# Patient Record
Sex: Female | Born: 1990 | Race: White | Hispanic: No | Marital: Married | Smoking: Never smoker
Health system: Southern US, Community
[De-identification: ages and names within clinical notes are randomized; demographics above are authoritative.]

## PROBLEM LIST (undated history)

## (undated) DIAGNOSIS — Z803 Family history of malignant neoplasm of breast: Secondary | ICD-10-CM

## (undated) DIAGNOSIS — F329 Major depressive disorder, single episode, unspecified: Secondary | ICD-10-CM

## (undated) DIAGNOSIS — Z8049 Family history of malignant neoplasm of other genital organs: Secondary | ICD-10-CM

## (undated) DIAGNOSIS — F32A Depression, unspecified: Secondary | ICD-10-CM

## (undated) HISTORY — DX: Family history of malignant neoplasm of other genital organs: Z80.49

## (undated) HISTORY — DX: Family history of malignant neoplasm of breast: Z80.3

## (undated) HISTORY — DX: Major depressive disorder, single episode, unspecified: F32.9

## (undated) HISTORY — DX: Depression, unspecified: F32.A

---

## 2014-06-17 ENCOUNTER — Ambulatory Visit
Admit: 2014-06-17 | Disposition: A | Discharge status: Home / Self Care | Payer: Self-pay | Attending: Family Medicine | Admitting: Family Medicine

## 2014-08-06 ENCOUNTER — Telehealth: Payer: Self-pay | Admitting: Family Medicine

## 2014-08-06 DIAGNOSIS — F329 Major depressive disorder, single episode, unspecified: Secondary | ICD-10-CM

## 2014-08-06 DIAGNOSIS — F32A Depression, unspecified: Secondary | ICD-10-CM

## 2014-08-06 DIAGNOSIS — F419 Anxiety disorder, unspecified: Principal | ICD-10-CM

## 2014-08-06 MED ORDER — CITALOPRAM HYDROBROMIDE 40 MG PO TABS: 40.0000 mg | ORAL_TABLET | Freq: Every day | ORAL | Status: DC

## 2014-08-06 NOTE — Telephone Encounter (Signed)
Pt would like a increase on her Citalopram.

## 2014-08-06 NOTE — Telephone Encounter (Signed)
New prescription sent to [facility]. Pt will need to follow-up on medication changed in 1 month. Pt called and informed of change and need for appt.

## 2014-08-06 NOTE — Telephone Encounter (Signed)
Pt states she would like to increase dosage of Citaloparam as discussed.   Pharmacy: [facility]

## 2014-12-16 ENCOUNTER — Ambulatory Visit (INDEPENDENT_AMBULATORY_CARE_PROVIDER_SITE_OTHER): Payer: 59

## 2014-12-16 DIAGNOSIS — Z23 Encounter for immunization: Secondary | ICD-10-CM | POA: Diagnosis not present

## 2015-01-04 ENCOUNTER — Other Ambulatory Visit: Payer: Self-pay | Admitting: Family Medicine

## 2015-03-24 ENCOUNTER — Ambulatory Visit (INDEPENDENT_AMBULATORY_CARE_PROVIDER_SITE_OTHER): Payer: 59 | Admitting: Family Medicine

## 2015-03-24 ENCOUNTER — Encounter: Payer: Self-pay | Admitting: Family Medicine

## 2015-03-24 VITALS — BP 96/67 | HR 84 | Temp 99.0°F | Resp 16 | Ht 63.0 in | Wt 121.0 lb

## 2015-03-24 DIAGNOSIS — F418 Other specified anxiety disorders: Secondary | ICD-10-CM

## 2015-03-24 DIAGNOSIS — F419 Anxiety disorder, unspecified: Secondary | ICD-10-CM

## 2015-03-24 DIAGNOSIS — Z3041 Encounter for surveillance of contraceptive pills: Secondary | ICD-10-CM | POA: Diagnosis not present

## 2015-03-24 DIAGNOSIS — B360 Pityriasis versicolor: Secondary | ICD-10-CM | POA: Diagnosis not present

## 2015-03-24 DIAGNOSIS — F32A Depression, unspecified: Secondary | ICD-10-CM

## 2015-03-24 DIAGNOSIS — F329 Major depressive disorder, single episode, unspecified: Secondary | ICD-10-CM

## 2015-03-24 DIAGNOSIS — F3341 Major depressive disorder, recurrent, in partial remission: Secondary | ICD-10-CM | POA: Insufficient documentation

## 2015-03-24 MED ORDER — NORETHIN ACE-ETH ESTRAD-FE 1-20 MG-MCG PO TABS: 1.0000 | ORAL_TABLET | Freq: Every day | ORAL | Status: DC

## 2015-03-24 MED ORDER — SALICYLIC ACID-SULFUR 2-2 % EX SHAM: MEDICATED_SHAMPOO | Freq: Every day | CUTANEOUS | Status: DC | PRN

## 2015-03-24 MED ORDER — TERBINAFINE HCL 1 % EX CREA: 1.0000 "application " | TOPICAL_CREAM | Freq: Two times a day (BID) | CUTANEOUS | Status: DC

## 2015-03-24 MED ORDER — CITALOPRAM HYDROBROMIDE 40 MG PO TABS: 40.0000 mg | ORAL_TABLET | Freq: Every day | ORAL | Status: DC

## 2015-03-24 NOTE — Assessment & Plan Note (Signed)
Continue Celexa at current dosing. Pt is doing well. RTC 6 mos.

## 2015-03-24 NOTE — Progress Notes (Signed)
Subjective:    Patient ID: Debra Hodge, female    DOB: 03-18-1990, 25 y.o.   MRN: [id number]  HPI: Debra Hodge is a 25 y.o. female presenting on 03/24/2015 for Contraception; Rash; and Depression   HPI  Pt presents for refills of her medications and rash today.  She needs a refill on her OCP today. LMP 02/27/2015- light periods on OCP's.  Taking every day-no missed doses. No new family history of blood clots. No personal history of blood clots. No migraines with aura.  Celexa: Doing well on . No side effects from medication. Tolerating well. SYmptoms are much improved.   Rash- seen by dermatology in the past. Red scaling patch on Shoulder. On stomach and back.  Rash has been worsening over the past 1-1.5years.  Treated first 5 years ago. She thought it was treated with an antifungal.  Home treatment: tea tree oil cream- no relief. Thomes Cake.   Past Medical History  Diagnosis Date  . Depression     No current outpatient prescriptions on file prior to visit.   No current facility-administered medications on file prior to visit.    Review of Systems  Constitutional: Negative for fever and chills.  HENT: Negative.   Respiratory: Negative for cough, chest tightness and wheezing.   Cardiovascular: Negative for chest pain and leg swelling.  Gastrointestinal: Negative for nausea, vomiting, abdominal pain, diarrhea and constipation.  Endocrine: Negative.  Negative for cold intolerance, heat intolerance, polydipsia, polyphagia and polyuria.  Genitourinary: Negative for dysuria and difficulty urinating.  Musculoskeletal: Negative.   Skin: Positive for rash.  Neurological: Negative for dizziness, light-headedness and numbness.  Psychiatric/Behavioral: Positive for dysphoric mood. Negative for suicidal ideas, behavioral problems, sleep disturbance and agitation.   Per HPI unless specifically indicated above     Objective:    BP 96/67 mmHg  Pulse 84  Temp(Src) 99 F (37.2 C)  (Oral)  Resp 16  Ht  (1.6 m)  Wt 121 lb (54.885 kg)  BMI 21.44 kg/m2  LMP 02/27/2015  Wt Readings from Last 3 Encounters:  03/24/15 121 lb (54.885 kg)    Physical Exam  Constitutional: She is oriented to person, place, and time. She appears well-developed and well-nourished.  HENT:  Head: Normocephalic and atraumatic.  Neck: Neck supple.  Cardiovascular: Normal rate, regular rhythm and normal heart sounds.  Exam reveals no gallop and no friction rub.   No murmur heard. Pulmonary/Chest: Effort normal and breath sounds normal. She has no wheezes. She exhibits no tenderness.  Abdominal: Soft. Normal appearance and bowel sounds are normal. She exhibits no distension and no mass. There is no tenderness. There is no rebound and no guarding.  Musculoskeletal: Normal range of motion. She exhibits no edema or tenderness.  Lymphadenopathy:    She has no cervical adenopathy.  Neurological: She is alert and oriented to person, place, and time.  Skin: Skin is warm and dry. Rash noted.  Red scaling rash on L shoulder, abdomen and upper back.    No results found for this or any previous visit.    Assessment & Plan:   Problem List Items Addressed This Visit      Musculoskeletal and Integument   Tinea versicolor - Primary    Avoid oral antifungal due to QTC elongation with Celexa. Trial of topical terbinafine and sulfur salicylic acid shampoo.  Return if not improving. Consider oral antifungal if resistent to topical treatment.       Relevant Medications   salicyclic acid-sulfur (SEBULEX)  2-2 % shampoo     Other   Anxiety and depression    Continue Celexa at current dosing. Pt is doing well. RTC 6 mos.       Relevant Medications   citalopram (CELEXA) 40 MG tablet   Surveillance of contraceptive pill    Change to Junel FE to make cheaper per patient's insurance. Pap's and pelvics done through GYN.         Relevant Medications   norethindrone-ethinyl estradiol (JUNEL FE 1/20)  1-20 MG-MCG tablet      Meds ordered this encounter  Medications  . terbinafine (LAMISIL) 1 % cream    Sig: Apply 1 application topically 2 (two) times daily.    Dispense:  30 g    Refill:  0    Order Specific Question:  Supervising Provider    Answer:  Janeann Forehand [[id number]]  . salicyclic acid-sulfur (SEBULEX) 2-2 % shampoo    Sig: Apply topically daily as needed for itching. Apply to rash on shoulder and abdomen. Allow to sit for 10 minutes before rinsing.    Dispense:  118 mL    Refill:  12    Order Specific Question:  Supervising Provider    Answer:  Janeann Forehand [phone]  . citalopram (CELEXA) 40 MG tablet    Sig: Take 1 tablet (40 mg total) by mouth daily.    Dispense:  30 tablet    Refill:  11    Order Specific Question:  Supervising Provider    Answer:  Janeann Forehand [phone]  . norethindrone-ethinyl estradiol (JUNEL FE 1/20) 1-20 MG-MCG tablet    Sig: Take 1 tablet by mouth daily.    Dispense:  1 Package    Refill:  11    Order Specific Question:  Supervising Provider    Answer:  Janeann Forehand [phone]      Follow up plan: Return in about 6 months (around 09/21/2015).

## 2015-03-24 NOTE — Assessment & Plan Note (Signed)
Change to Junel FE to make cheaper per patient's insurance. Pap's and pelvics done through GYN.

## 2015-03-24 NOTE — Patient Instructions (Signed)
Tinea Versicolor Tinea versicolor is a common fungal infection of the skin. It causes a rash that appears as light or dark patches on the skin. The rash most often occurs on the chest, back, neck, or upper arms. This condition is more common during warm weather. Other than affecting how your skin looks, tinea versicolor usually does not cause other problems. In most cases, the infection goes away in a few weeks with treatment. It may take a few months for the patches on your skin to clear up. CAUSES Tinea versicolor occurs when a type of fungus that is normally present on the skin starts to overgrow. This fungus is a kind of yeast. The exact cause of the overgrowth is not known. This condition cannot be passed from one person to another (noncontagious). RISK FACTORS This condition is more likely to develop when certain factors are present, such as:  Heat and humidity.  Sweating too much.  Hormone changes.  Oily skin.  A weak defense (immune) system. SYMPTOMS Symptoms of this condition may include:  A rash on your skin that is made up of light or dark patches. The rash may have:  Patches of tan or pink spots on light skin.  Patches of white or brown spots on dark skin.  Patches of skin that do not tan.  Well-marked edges.  Scales on the discolored areas.  Mild itching. DIAGNOSIS A health care provider can usually diagnose this condition by looking at your skin. During the exam, he or she may use ultraviolet light to help determine the extent of the infection. In some cases, a skin sample may be taken by scraping the rash. This sample will be viewed under a microscope to check for yeast overgrowth. TREATMENT Treatment for this condition may include:  Dandruff shampoo that is applied to the affected skin during showers or bathing.  Over-the-counter medicated skin cream, lotion, or soaps.  Prescription antifungal medicine in the form of skin cream or pills.  Medicine to help  reduce itching. HOME CARE INSTRUCTIONS  Take medicines only as directed by your health care provider.  Apply dandruff shampoo to the affected area if told to do so by your health care provider. You may be instructed to scrub the affected skin for several minutes each day.  Do not scratch the affected area of skin.  Avoid hot and humid conditions.  Do not use tanning booths.  Try to avoid sweating a lot. SEEK MEDICAL CARE IF:  Your symptoms get worse.  You have a fever.  You have redness, swelling, or pain at the site of your rash.  You have fluid, blood, or pus coming from your rash.  Your rash returns after treatment.   This information is not intended to replace advice given to you by your health care provider. Make sure you discuss any questions you have with your health care provider.   Document Released: 02/06/2000 Document Revised: 03/01/2014 Document Reviewed: 11/20/2013 Elsevier Interactive Patient Education 2016 Elsevier Inc. Tinea Versicolor Tinea versicolor is a common fungal infection of the skin. It causes a rash that appears as light or dark patches on the skin. The rash most often occurs on the chest, back, neck, or upper arms. This condition is more common during warm weather. Other than affecting how your skin looks, tinea versicolor usually does not cause other problems. In most cases, the infection goes away in a few weeks with treatment. It may take a few months for the patches on your skin to  clear up. CAUSES Tinea versicolor occurs when a type of fungus that is normally present on the skin starts to overgrow. This fungus is a kind of yeast. The exact cause of the overgrowth is not known. This condition cannot be passed from one person to another (noncontagious). RISK FACTORS This condition is more likely to develop when certain factors are present, such as:  Heat and humidity.  Sweating too much.  Hormone changes.  Oily skin.  A weak defense  (immune) system. SYMPTOMS Symptoms of this condition may include:  A rash on your skin that is made up of light or dark patches. The rash may have:  Patches of tan or pink spots on light skin.  Patches of white or brown spots on dark skin.  Patches of skin that do not tan.  Well-marked edges.  Scales on the discolored areas.  Mild itching. DIAGNOSIS A health care provider can usually diagnose this condition by looking at your skin. During the exam, he or she may use ultraviolet light to help determine the extent of the infection. In some cases, a skin sample may be taken by scraping the rash. This sample will be viewed under a microscope to check for yeast overgrowth. TREATMENT Treatment for this condition may include:  Dandruff shampoo that is applied to the affected skin during showers or bathing.  Over-the-counter medicated skin cream, lotion, or soaps.  Prescription antifungal medicine in the form of skin cream or pills.  Medicine to help reduce itching. HOME CARE INSTRUCTIONS  Take medicines only as directed by your health care provider.  Apply dandruff shampoo to the affected area if told to do so by your health care provider. You may be instructed to scrub the affected skin for several minutes each day.  Do not scratch the affected area of skin.  Avoid hot and humid conditions.  Do not use tanning booths.  Try to avoid sweating a lot. SEEK MEDICAL CARE IF:  Your symptoms get worse.  You have a fever.  You have redness, swelling, or pain at the site of your rash.  You have fluid, blood, or pus coming from your rash.  Your rash returns after treatment.   This information is not intended to replace advice given to you by your health care provider. Make sure you discuss any questions you have with your health care provider.   Document Released: 02/06/2000 Document Revised: 03/01/2014 Document Reviewed: 11/20/2013 Elsevier Interactive Patient Education [employer].

## 2015-03-24 NOTE — Assessment & Plan Note (Signed)
Avoid oral antifungal due to QTC elongation with Celexa. Trial of topical terbinafine and sulfur salicylic acid shampoo.  Return if not improving. Consider oral antifungal if resistent to topical treatment.

## 2015-07-29 ENCOUNTER — Telehealth: Payer: Self-pay | Admitting: Family Medicine

## 2015-07-29 NOTE — Telephone Encounter (Signed)
Pt.have requested that you call her back about her anxiety. Pt call back # is  [phone]

## 2015-07-31 NOTE — Telephone Encounter (Signed)
Pt is struggling with her anxiety lately. She would like to seek counseling. I have recommended [facility] in [place]. She will call today. She will bring in paperwork for disability. Thanks! AK

## 2015-08-04 ENCOUNTER — Ambulatory Visit (INDEPENDENT_AMBULATORY_CARE_PROVIDER_SITE_OTHER): Payer: 59 | Admitting: Family Medicine

## 2015-08-04 ENCOUNTER — Encounter: Payer: Self-pay | Admitting: Family Medicine

## 2015-08-04 VITALS — BP 99/58 | HR 87 | Temp 99.3°F | Resp 16 | Ht 63.0 in | Wt 121.0 lb

## 2015-08-04 DIAGNOSIS — F419 Anxiety disorder, unspecified: Principal | ICD-10-CM

## 2015-08-04 DIAGNOSIS — F418 Other specified anxiety disorders: Secondary | ICD-10-CM | POA: Diagnosis not present

## 2015-08-04 DIAGNOSIS — F329 Major depressive disorder, single episode, unspecified: Secondary | ICD-10-CM

## 2015-08-04 DIAGNOSIS — F32A Depression, unspecified: Secondary | ICD-10-CM

## 2015-08-04 LAB — VITAMIN B12: VITAMIN B 12: 427 pg/mL (ref 200–1100)

## 2015-08-04 LAB — FOLATE: Folate: 14 ng/mL (ref >5.4)

## 2015-08-04 LAB — TSH: TSH: 0.84 mIU/L

## 2015-08-04 MED ORDER — BUSPIRONE HCL 15 MG PO TABS: ORAL_TABLET | ORAL | Status: DC

## 2015-08-04 NOTE — Patient Instructions (Addendum)
Let's try switching to Buspirone for anxiety. Take 1/2 tablet of celexa once daily for 1 week and then stop. Take 1/2 tablet of the buspirone once daily for 1 week and then do twice daily for 1 week and then take a full tablet twice daily.   Call [facility] to set up counseling.  30 minutes of exercise- once daily can help with anxiety and stress.  Taking 2000IU vitamin D daily OTC can help with depression symptoms.  Lot's of good resources for mindfulness and mediation online- youtube.

## 2015-08-04 NOTE — Assessment & Plan Note (Signed)
Recommend counseling at this  Point for coping skills. Pt will call [facility] today. Trial of buspar to help with anxiety symptoms. Reduce dose of celexa at this time. Discussed mindfulness, meditation, and coping skills.  25 minutes of this visit was spent face to face counseling on plan of care and anxiety treatment.

## 2015-08-04 NOTE — Progress Notes (Signed)
Subjective:    Patient ID: Debra Hodge, female    DOB: [birth year], 25 y.o.   MRN: [id number]  HPI: Debra Hodge is a 25 y.o. female presenting on 08/04/2015 for Follow-up and Anxiety   HPI  Pt presents for anxiety folllow-up. Her anxiety is increasing over the past few months. Thought it was school related- work and school full-time. 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 Crying when she had to call people on the phone. Hard to breath and sweaty when she has to interact with public. Occurring over the past 3 weeks. Now is out of work on STD. Not feeling like the celexa is helping with anxiety. Hard to concentrate at times. Anxiety- unable to handle it like she used to. 1 full-time semester and part-time of school- studying HR. Feels like she can't hand work right now. Outside of work- lots of anxiety about going out and being around people. Threw-up prior to wedding- heat vs. Anxiety. Is able to complete ADL's. Situations where she needs to have a relationship with a person make her very anxious. No SI.   Past Medical History  Diagnosis Date  . Depression     Current Outpatient Prescriptions on File Prior to Visit  Medication Sig  . citalopram (CELEXA) 40 MG tablet Take 1 tablet (40 mg total) by mouth daily.  . norethindrone-ethinyl estradiol (JUNEL FE 1/20) 1-20 MG-MCG tablet Take 1 tablet by mouth daily.  . salicyclic acid-sulfur (SEBULEX) 2-2 % shampoo Apply topically daily as needed for itching. Apply to rash on shoulder and abdomen. Allow to sit for 10 minutes before rinsing.  . terbinafine (LAMISIL) 1 % cream Apply 1 application topically 2 (two) times daily. (Patient taking differently: Apply 1 application topically 2 (two) times daily as needed. )   No current facility-administered medications on file prior to visit.    Review of Systems  Constitutional: Negative for fever and chills.  HENT: Negative.   Respiratory: Negative for  cough, chest tightness and wheezing.   Cardiovascular: Negative for chest pain and leg swelling.  Gastrointestinal: Negative for nausea, vomiting, abdominal pain, diarrhea and constipation.  Endocrine: Negative.  Negative for cold intolerance, heat intolerance, polydipsia, polyphagia and polyuria.  Genitourinary: Negative for dysuria and difficulty urinating.  Musculoskeletal: Negative.   Neurological: Negative for dizziness, light-headedness and numbness.  Psychiatric/Behavioral: Positive for decreased concentration. Negative for suicidal ideas and self-injury. The patient is nervous/anxious.    Per HPI unless specifically indicated above GAD 7 : Generalized Anxiety Score 08/04/2015  Nervous, Anxious, on Edge 3  Control/stop worrying 3  Worry too much - different things 2  Trouble relaxing 2  Restless 2  Easily annoyed or irritable 2  Afraid - awful might happen 1  Total GAD 7 Score 15  Anxiety Difficulty Very difficult      Objective:    BP 99/58 mmHg  Pulse 87  Temp(Src) 99.3 F (37.4 C) (Oral)  Resp 16  Ht  (1.6 m)  Wt 121 lb (54.885 kg)  BMI 21.44 kg/m2  Wt Readings from Last 3 Encounters:  08/04/15 121 lb (54.885 kg)  03/24/15 121 lb (54.885 kg)    Physical Exam  Constitutional: She is oriented to person, place, and time. She appears well-developed and well-nourished.  HENT:  Head: Normocephalic and atraumatic.  Neck: Neck supple.  Cardiovascular: Normal rate, regular rhythm and normal heart sounds.  Exam reveals no gallop and no friction rub.  No murmur heard. Pulmonary/Chest: Effort normal and breath sounds normal. She has no wheezes. She exhibits no tenderness.  Abdominal: Soft. Normal appearance and bowel sounds are normal. She exhibits no distension and no mass. There is no tenderness. There is no rebound and no guarding.  Musculoskeletal: Normal range of motion. She exhibits no edema or tenderness.  Lymphadenopathy:    She has no cervical adenopathy.    Neurological: She is alert and oriented to person, place, and time.  Skin: Skin is warm and dry.  Psychiatric: Her speech is normal and behavior is normal. Judgment and thought content normal. Her mood appears anxious. Cognition and memory are normal. She expresses no suicidal ideation. She expresses no suicidal plans.   No results found for this or any previous visit.    Assessment & Plan:   Problem List Items Addressed This Visit      Other   Anxiety and depression - Primary    Recommend counseling at this  Point for coping skills. Pt will call [facility] today. Trial of buspar to help with anxiety symptoms. Reduce dose of celexa at this time. Discussed mindfulness, meditation, and coping skills.  25 minutes of this visit was spent face to face counseling on plan of care and anxiety treatment.       Relevant Medications   busPIRone (BUSPAR) 15 MG tablet   Other Relevant Orders   TSH   Vitamin D (25 hydroxy)   Vitamin B12   Folate   BASIC METABOLIC PANEL WITH GFR      Meds ordered this encounter  Medications  . busPIRone (BUSPAR) 15 MG tablet    Sig: Take 1/2 tablet twice daily for 1 week and then increase to 1 tablet twice daily.    Dispense:  60 tablet    Refill:  11    Order Specific Question:  Supervising Provider    Answer:  Janeann ForehandHAWKINS JR, JAMES H [phone][[id number]]      Follow up plan: Return in about 4 weeks (around 09/01/2015) for anxiety. .Marland Kitchen

## 2015-08-05 ENCOUNTER — Telehealth: Payer: Self-pay | Admitting: Family Medicine

## 2015-08-05 LAB — BASIC METABOLIC PANEL WITH GFR
BUN: 10 mg/dL (ref 7–25)
CO2: 22 mmol/L (ref 20–31)
Calcium: 9.4 mg/dL (ref 8.6–10.2)
Chloride: 104 mmol/L (ref 98–110)
Creat: 0.66 mg/dL (ref 0.50–1.10)
Glucose, Bld: 74 mg/dL (ref 65–99)
POTASSIUM: 4.3 mmol/L (ref 3.5–5.3)
Sodium: 139 mmol/L (ref 135–146)

## 2015-08-05 LAB — VITAMIN D 25 HYDROXY (VIT D DEFICIENCY, FRACTURES): Vit D, 25-Hydroxy: 30 ng/mL (ref 30–100)

## 2015-08-05 NOTE — Telephone Encounter (Signed)
Reviewed labs with patient. She will pick up her leave paperwork.

## 2015-09-01 ENCOUNTER — Ambulatory Visit (INDEPENDENT_AMBULATORY_CARE_PROVIDER_SITE_OTHER): Payer: 59 | Admitting: Family Medicine

## 2015-09-01 VITALS — BP 94/64 | HR 87 | Temp 98.7°F | Resp 16 | Ht 63.0 in | Wt 123.6 lb

## 2015-09-01 DIAGNOSIS — F418 Other specified anxiety disorders: Secondary | ICD-10-CM | POA: Diagnosis not present

## 2015-09-01 DIAGNOSIS — F329 Major depressive disorder, single episode, unspecified: Secondary | ICD-10-CM

## 2015-09-01 DIAGNOSIS — F32A Depression, unspecified: Secondary | ICD-10-CM

## 2015-09-01 DIAGNOSIS — F419 Anxiety disorder, unspecified: Principal | ICD-10-CM

## 2015-09-01 NOTE — Patient Instructions (Signed)
I am glad you are doing better! Let's have you come back 1 mos after starting work to check in an make sure your symptoms are still improving. Please continue to follow the guidance of Myrene BuddySheryl Harper at [facility].

## 2015-09-01 NOTE — Progress Notes (Signed)
Subjective:    Patient ID: Debra Hodge, female    DOB: [birth year], 25 y.o.   MRN: [id number]  HPI: Debra Hodge is a 25 y.o. female presenting on 09/01/2015 for Depression and Anxiety   HPI  Pt presents for anxiety follow-up. I s doing well. Started counseling. Feels she is ready to go back to work. Symptoms affecting homelife have resolved. Feels ready to interact with public at work. Medication is helping. Still continuing counseling at [facility]. Doing well.    Past Medical History  Diagnosis Date  . Depression     Current Outpatient Prescriptions on File Prior to Visit  Medication Sig  . busPIRone (BUSPAR) 15 MG tablet Take 1/2 tablet twice daily for 1 week and then increase to 1 tablet twice daily.  . citalopram (CELEXA) 40 MG tablet Take 1 tablet (40 mg total) by mouth daily.  . norethindrone-ethinyl estradiol (JUNEL FE 1/20) 1-20 MG-MCG tablet Take 1 tablet by mouth daily.  . salicyclic acid-sulfur (SEBULEX) 2-2 % shampoo Apply topically daily as needed for itching. Apply to rash on shoulder and abdomen. Allow to sit for 10 minutes before rinsing.  . terbinafine (LAMISIL) 1 % cream Apply 1 application topically 2 (two) times daily. (Patient taking differently: Apply 1 application topically 2 (two) times daily as needed. )   No current facility-administered medications on file prior to visit.    Review of Systems  Constitutional: Negative for fever and chills.  HENT: Negative.   Respiratory: Negative for cough, chest tightness and wheezing.   Cardiovascular: Negative for chest pain and leg swelling.  Gastrointestinal: Negative for nausea, vomiting, abdominal pain, diarrhea and constipation.  Endocrine: Negative.  Negative for cold intolerance, heat intolerance, polydipsia, polyphagia and polyuria.  Genitourinary: Negative for dysuria and difficulty urinating.  Musculoskeletal: Negative.   Neurological: Negative for dizziness, light-headedness and numbness.    Psychiatric/Behavioral: Negative for suicidal ideas, behavioral problems, sleep disturbance and dysphoric mood. The patient is nervous/anxious.    Per HPI unless specifically indicated above     Objective:    BP 94/64 mmHg  Pulse 87  Temp(Src) 98.7 F (37.1 C) (Oral)  Resp 16  Ht 5\' 3"  (1.6 m)  Wt 123 lb 9.6 oz (56.065 kg)  BMI 21.90 kg/m2  LMP 08/07/2015  Wt Readings from Last 3 Encounters:  09/01/15 123 lb 9.6 oz (56.065 kg)  08/04/15 121 lb (54.885 kg)  03/24/15 121 lb (54.885 kg)    GAD 7 : Generalized Anxiety Score 09/01/2015 08/04/2015  Nervous, Anxious, on Edge 2 3  Control/stop worrying 1 3  Worry too much - different things 1 2  Trouble relaxing 1 2  Restless 0 2  Easily annoyed or irritable 1 2  Afraid - awful might happen 0 1  Total GAD 7 Score 6 15  Anxiety Difficulty Not difficult at all Very difficult      Physical Exam  Constitutional: She is oriented to person, place, and time. She appears well-developed and well-nourished.  HENT:  Head: Normocephalic and atraumatic.  Neck: Neck supple.  Cardiovascular: Normal rate, regular rhythm and normal heart sounds.  Exam reveals no gallop and no friction rub.   No murmur heard. Pulmonary/Chest: Effort normal and breath sounds normal. She has no wheezes. She exhibits no tenderness.  Abdominal: Soft. Normal appearance and bowel sounds are normal. She exhibits no distension and no mass. There is no tenderness. There is no rebound and no guarding.  Musculoskeletal: Normal range of motion. She exhibits no edema  or tenderness.  Lymphadenopathy:    She has no cervical adenopathy.  Neurological: She is alert and oriented to person, place, and time.  Skin: Skin is warm and dry.  Psychiatric: She has a normal mood and affect. Her behavior is normal. Judgment and thought content normal.   Results for orders placed or performed in visit on 08/04/15  TSH  Result Value Ref Range   TSH 0.84 mIU/L  Vitamin D (25 hydroxy)   Result Value Ref Range   Vit D, 25-Hydroxy 30 30 - 100 ng/mL  Vitamin B12  Result Value Ref Range   Vitamin B-12 427 200 - 1100 pg/mL  Folate  Result Value Ref Range   Folate 14.0 >5.4 ng/mL  BASIC METABOLIC PANEL WITH GFR  Result Value Ref Range   Sodium 139 135 - 146 mmol/L   Potassium 4.3 3.5 - 5.3 mmol/L   Chloride 104 98 - 110 mmol/L   CO2 22 20 - 31 mmol/L   Glucose, Bld 74 65 - 99 mg/dL   BUN 10 7 - 25 mg/dL   Creat 1.61 0.96 - 0.45 mg/dL   Calcium 9.4 8.6 - 40.9 mg/dL   GFR, Est African American >89 >=60 mL/min   GFR, Est Non African American >89 >=60 mL/min      Assessment & Plan:   Problem List Items Addressed This Visit    None      No orders of the defined types were placed in this encounter.      Follow up plan: No Follow-up on file.

## 2015-10-06 ENCOUNTER — Encounter: Payer: Self-pay | Admitting: Family Medicine

## 2015-10-06 ENCOUNTER — Ambulatory Visit (INDEPENDENT_AMBULATORY_CARE_PROVIDER_SITE_OTHER): Payer: 59 | Admitting: Family Medicine

## 2015-10-06 VITALS — BP 92/62 | HR 82 | Temp 98.7°F | Resp 16 | Ht 63.0 in | Wt 124.6 lb

## 2015-10-06 DIAGNOSIS — F418 Other specified anxiety disorders: Secondary | ICD-10-CM

## 2015-10-06 DIAGNOSIS — F32A Depression, unspecified: Secondary | ICD-10-CM

## 2015-10-06 DIAGNOSIS — F419 Anxiety disorder, unspecified: Principal | ICD-10-CM

## 2015-10-06 DIAGNOSIS — F329 Major depressive disorder, single episode, unspecified: Secondary | ICD-10-CM

## 2015-10-06 NOTE — Patient Instructions (Signed)
Overall it sounds like your anxiety is doing well at this time. I would recommend staying with counseling throughout the semester as stressors pick up.

## 2015-10-06 NOTE — Assessment & Plan Note (Addendum)
Doing well. Back at baseline. Continue buspar. Work is going well. Encouraged pt to continue counseling. Recheck 6 mos or as needed.

## 2015-10-06 NOTE — Progress Notes (Signed)
Subjective:    Patient ID: Debra Hodge, female    DOB: 1990/05/16, 25 y.o.   MRN: [id number]  HPI: Debra Hodge is a 25 y.o. female presenting on 10/06/2015 for Depression (getting improved) and Anxiety (getting improved)   HPI  Pt presents for follow-up of anxiety. Back at work. Feels like she is doing well. Some stressors. Still seeing [facility] to help with coping skills. The semester has just started.  Overall feeling well.   Past Medical History:  Diagnosis Date  . Depression     Current Outpatient Prescriptions on File Prior to Visit  Medication Sig  . busPIRone (BUSPAR) 15 MG tablet Take 1/2 tablet twice daily for 1 week and then increase to 1 tablet twice daily.  . norethindrone-ethinyl estradiol (JUNEL FE 1/20) 1-20 MG-MCG tablet Take 1 tablet by mouth daily.  . salicyclic acid-sulfur (SEBULEX) 2-2 % shampoo Apply topically daily as needed for itching. Apply to rash on shoulder and abdomen. Allow to sit for 10 minutes before rinsing.  . terbinafine (LAMISIL) 1 % cream Apply 1 application topically 2 (two) times daily. (Patient taking differently: Apply 1 application topically 2 (two) times daily as needed. )   No current facility-administered medications on file prior to visit.     Review of Systems  Constitutional: Negative for chills and fever.  HENT: Negative.   Respiratory: Negative for cough, chest tightness and wheezing.   Cardiovascular: Negative for chest pain and leg swelling.  Gastrointestinal: Negative for abdominal pain, constipation, diarrhea, nausea and vomiting.  Endocrine: Negative.  Negative for cold intolerance, heat intolerance, polydipsia, polyphagia and polyuria.  Genitourinary: Negative for difficulty urinating and dysuria.  Musculoskeletal: Negative.   Neurological: Negative for dizziness, light-headedness and numbness.  Psychiatric/Behavioral: Negative.    Per HPI unless specifically indicated above     Objective:    BP 92/62  (BP Location: Left Arm, Patient Position: Sitting, Cuff Size: Normal)   Pulse 82   Temp 98.7 F (37.1 C) (Oral)   Resp 16   Ht 5\' 3"  (1.6 m)   Wt 124 lb 9.6 oz (56.5 kg)   BMI 22.07 kg/m   Wt Readings from Last 3 Encounters:  10/06/15 124 lb 9.6 oz (56.5 kg)  09/01/15 123 lb 9.6 oz (56.1 kg)  08/04/15 121 lb (54.9 kg)    Physical Exam  Constitutional: She is oriented to person, place, and time. She appears well-developed and well-nourished.  HENT:  Head: Normocephalic and atraumatic.  Neck: Neck supple.  Cardiovascular: Normal rate, regular rhythm and normal heart sounds.  Exam reveals no gallop and no friction rub.   No murmur heard. Pulmonary/Chest: Effort normal and breath sounds normal. She has no wheezes. She exhibits no tenderness.  Abdominal: Soft. Normal appearance and bowel sounds are normal. She exhibits no distension and no mass. There is no tenderness. There is no rebound and no guarding.  Musculoskeletal: Normal range of motion. She exhibits no edema or tenderness.  Lymphadenopathy:    She has no cervical adenopathy.  Neurological: She is alert and oriented to person, place, and time.  Skin: Skin is warm and dry.  Psychiatric: She has a normal mood and affect. Her behavior is normal. Judgment and thought content normal.   Results for orders placed or performed in visit on 08/04/15  TSH  Result Value Ref Range   TSH 0.84 mIU/L  Vitamin D (25 hydroxy)  Result Value Ref Range   Vit D, 25-Hydroxy 30 30 - 100 ng/mL  Vitamin  B12  Result Value Ref Range   Vitamin B-12 427 200 - 1,100 pg/mL  Folate  Result Value Ref Range   Folate 14.0 >5.4 ng/mL  BASIC METABOLIC PANEL WITH GFR  Result Value Ref Range   Sodium 139 135 - 146 mmol/L   Potassium 4.3 3.5 - 5.3 mmol/L   Chloride 104 98 - 110 mmol/L   CO2 22 20 - 31 mmol/L   Glucose, Bld 74 65 - 99 mg/dL   BUN 10 7 - 25 mg/dL   Creat 1.610.66 0.960.50 - 0.451.10 mg/dL   Calcium 9.4 8.6 - 40.910.2 mg/dL   GFR, Est African American  >89 >=60 mL/min   GFR, Est Non African American >89 >=60 mL/min      Assessment & Plan:   Problem List Items Addressed This Visit      Other   Anxiety and depression - Primary    Doing well. Back at baseline. Continue buspar. Work is going well. Encouraged pt to continue counseling. Recheck 6 mos or as needed.         Other Visit Diagnoses   None.     No orders of the defined types were placed in this encounter.     Follow up plan: Return if symptoms worsen or fail to improve.

## 2016-04-14 LAB — HM PAP SMEAR: HM Pap smear: NORMAL

## 2016-09-17 ENCOUNTER — Encounter: Payer: Self-pay | Admitting: Nurse Practitioner

## 2016-09-17 ENCOUNTER — Ambulatory Visit (INDEPENDENT_AMBULATORY_CARE_PROVIDER_SITE_OTHER): Payer: 59 | Admitting: Nurse Practitioner

## 2016-09-17 DIAGNOSIS — F329 Major depressive disorder, single episode, unspecified: Secondary | ICD-10-CM

## 2016-09-17 DIAGNOSIS — F419 Anxiety disorder, unspecified: Secondary | ICD-10-CM

## 2016-09-17 DIAGNOSIS — F32A Depression, unspecified: Secondary | ICD-10-CM

## 2016-09-17 MED ORDER — BUSPIRONE HCL 5 MG PO TABS: ORAL_TABLET | ORAL | 1 refills | Status: DC

## 2016-09-17 MED ORDER — MIRTAZAPINE 7.5 MG PO TABS: 7.5000 mg | ORAL_TABLET | Freq: Every day | ORAL | 1 refills | Status: DC

## 2016-09-17 NOTE — Progress Notes (Signed)
Subjective:    Patient ID: Debra Hodge, female    DOB: 01-13-1991, 26 y.o.   MRN: [id number]  Debra Hodge is a 26 y.o. female presenting on 09/17/2016 for Anxiety (the pt states the Buspar makes her tired.)   HPI Anxiety Pt has not been taking buspar twice daily - only at night.  Buspar makes her tired and works well at night for assistance w/ sleep.    Concerned for sleepiness while driving.    She has finished school in May and is working from home and is enjoying this job and role now with much less anxiety.  Undereating is tendency w/ anxiety. Eats small meals 1-2 times daily and is also eating healthier when preparing food from home instead of fast food. Has lost about 20 lbs since May and her last visit. She had previously been at a stable weight despite poor appetite.  If more anxious - sharp stomach pains, nausea.  She has not had any of these GI symptoms in months, but does notice feel full really quickly, poor appetite.  Has tried medications other than buspirone in past: Didn't like lexapro - sweaty feeling Citalopram 40 mg - less effective over time   [facility] longer [facility] w/ Anell Barrheryl Harper - still uses tools learned there.  Would consider returning later date if needed.   Social History  Substance Use Topics  . Smoking status: Never Smoker  . Smokeless tobacco: Never Used  . Alcohol use 0.0 oz/week     Comment: ocaasionally    Review of Systems Per HPI unless specifically indicated above     Objective:    BP 94/61 (BP Location: Right Arm, Patient Position: Sitting, Cuff Size: Small)   Pulse 86   Temp 98.5 F (36.9 C) (Oral)   Ht 5\' 3"  (1.6 m)   Wt 105 lb 12.8 oz (48 kg)   BMI 18.74 kg/m    Wt Readings from Last 3 Encounters:  09/17/16 105 lb 12.8 oz (48 kg)  10/06/15 124 lb 9.6 oz (56.5 kg)  09/01/15 123 lb 9.6 oz (56.1 kg)     Physical Exam  General - underweight, thin appearing, NAD HEENT - Normocephalic,  atraumatic Heart - RRR, no murmurs heard Lungs - Clear throughout all lobes, no wheezing, crackles, or rhonchi. Normal work of breathing. Extremeties - non-tender, no edema, cap refill < 2 seconds, peripheral pulses intact +2 bilaterally Skin - warm, dry Neuro - awake, alert, normal gait, minor tremor present at rest w/ fingers extended Psych - Normal mood and affect, normal behavior     Results for orders placed or performed in visit on 08/04/15  TSH  Result Value Ref Range   TSH 0.84 mIU/L  Vitamin D (25 hydroxy)  Result Value Ref Range   Vit D, 25-Hydroxy 30 30 - 100 ng/mL  Vitamin B12  Result Value Ref Range   Vitamin B-12 427 200 - 1,100 pg/mL  Folate  Result Value Ref Range   Folate 14.0 >5.4 ng/mL  BASIC METABOLIC PANEL WITH GFR  Result Value Ref Range   Sodium 139 135 - 146 mmol/L   Potassium 4.3 3.5 - 5.3 mmol/L   Chloride 104 98 - 110 mmol/L   CO2 22 20 - 31 mmol/L   Glucose, Bld 74 65 - 99 mg/dL   BUN 10 7 - 25 mg/dL   Creat 4.090.66 8.110.50 - 9.141.10 mg/dL   Calcium 9.4 8.6 - 78.210.2 mg/dL   GFR, Est  African American >89 >=60 mL/min   GFR, Est Non African American >89 >=60 mL/min      Assessment & Plan:   Problem List Items Addressed This Visit      Other   Anxiety and depression    Doing well overall. Pt feels she is at baseline with her ability to manage anxiety.  Perceived confrontation remains predominant situation where she experiences significant anxiety.  Pt with weight loss secondary to calorie undernutrition, poor appetite with anxiety.  Some difficulty sleeping and notes buspar helped significantly with onset of sleep.  Had not taken buspar 15 mg bid r/t drowsiness.  Plan: 1. Recommend counseling again if needed. 2. REDUCE dose: buspirone 5 mg bid. 3. Start mirtazapine 7.5 mg once at bedtime (hope will also help with appetite and weight gain). 4. Follow up 6 weeks.      Relevant Medications   busPIRone (BUSPAR) 5 MG tablet   mirtazapine (REMERON) 7.5 MG  tablet      Meds ordered this encounter  Medications  . BLISOVI FE 1.5/30 1.5-30 MG-MCG tablet    Sig: TK Q T PO QD    Refill:  11  . busPIRone (BUSPAR) 5 MG tablet    Sig: Take 1 tablet in daytime and 2 tablets at night.    Dispense:  90 tablet    Refill:  1    Order Specific Question:   Supervising Provider    Answer:   Smitty CordsKARAMALEGOS, ALEXANDER J [[id number]]  . mirtazapine (REMERON) 7.5 MG tablet    Sig: Take 1 tablet (7.5 mg total) by mouth at bedtime.    Dispense:  30 tablet    Refill:  1    Order Specific Question:   Supervising Provider    Answer:   Smitty CordsKARAMALEGOS, ALEXANDER J [[id number]]      Follow up plan: Return in about 6 weeks (around 10/29/2016) for anxiety and depression.   Debra McardleLauren Larone Kliethermes, DNP, AGPCNP-BC Adult Gerontology Primary Care Nurse Practitioner [facility] 09/21/2016, 8:14 AM

## 2016-09-17 NOTE — Patient Instructions (Addendum)
Debra Hodge, Thank you for coming in to clinic today.  1. For your anxiety and depression: - Change buspirone to 5 mg bid and take 10 mg at night if needed. - START mirtazapine 7.5 mg at night.   Please schedule a follow-up appointment with Wilhelmina McardleLauren Myshawn Chiriboga, AGNP. Return in about 6 weeks (around 10/29/2016) for anxiety and depression.   If you have any other questions or concerns, please feel free to call the clinic or send a message through MyChart. You may also schedule an earlier appointment if necessary.  You will receive a survey after today's visit either digitally by e-mail or paper by [employer] mail. Your experiences and feedback matter to us.  Please respond so we know how we are doing as we provide care for you.   Wilhelmina McardleLauren Leoncio Hansen, DNP, AGNP-BC Adult Gerontology Nurse Practitioner [facility], [facility]

## 2016-09-21 NOTE — Assessment & Plan Note (Addendum)
Doing well overall. Pt feels she is at baseline with her ability to manage anxiety.  Perceived confrontation remains predominant situation where she experiences significant anxiety.  Pt with weight loss secondary to calorie undernutrition, poor appetite with anxiety.  Some difficulty sleeping and notes buspar helped significantly with onset of sleep.  Had not taken buspar 15 mg bid r/t drowsiness.  Plan: 1. Recommend counseling again if needed. 2. REDUCE dose: buspirone 5 mg bid. 3. Start mirtazapine 7.5 mg once at bedtime (hope will also help with appetite and weight gain). 4. Follow up 6 weeks.

## 2016-09-21 NOTE — Progress Notes (Signed)
I have reviewed this encounter including the documentation in this note and/or discussed this patient with the provider, Wilhelmina McardleLauren Kennedy, AGPCNP-BC. I am certifying that I agree with the content of this note as supervising physician.  Saralyn PilarAlexander Amaziah Raisanen, DO [facility] 09/21/2016, 10:42 AM

## 2016-10-28 ENCOUNTER — Encounter: Payer: Self-pay | Admitting: Nurse Practitioner

## 2016-10-28 ENCOUNTER — Ambulatory Visit (INDEPENDENT_AMBULATORY_CARE_PROVIDER_SITE_OTHER): Payer: 59 | Admitting: Nurse Practitioner

## 2016-10-28 VITALS — BP 94/56 | HR 93 | Temp 97.8°F | Ht 63.0 in | Wt 111.8 lb

## 2016-10-28 DIAGNOSIS — F329 Major depressive disorder, single episode, unspecified: Secondary | ICD-10-CM

## 2016-10-28 DIAGNOSIS — F419 Anxiety disorder, unspecified: Secondary | ICD-10-CM

## 2016-10-28 DIAGNOSIS — F32A Depression, unspecified: Secondary | ICD-10-CM

## 2016-10-28 NOTE — Patient Instructions (Addendum)
Debra Hodge, Thank you for coming in to clinic today.  1. Continue meds without changes today. - mirtazapine 7.5 mg at bedtime. - buspirone 5 mg twice daily.   2. For your sleep: - Really work on your sleep hygiene.   Sleep Hygiene Tips  Take medicines only as directed by your health care provider.  Keep regular sleeping and waking hours. Avoid naps.  Keep a sleep diary to help you and your health care provider figure out what could be causing your insomnia. Include:  When you sleep.  When you wake up during the night.  How well you sleep.  How rested you feel the next day.  Any side effects of medicines you are taking.  What you eat and drink.  Make your bedroom a comfortable place where it is easy to fall asleep:  Put up shades or special blackout curtains to block light from outside.  Use a white noise machine to block noise.  Keep the temperature cool.  Exercise regularly as directed by your health care provider. Avoid exercising right before bedtime.  Use relaxation techniques to manage stress. Ask your health care provider to suggest some techniques that may work well for you. These may include:  Breathing exercises.  Routines to release muscle tension.  Visualizing peaceful scenes.  Cut back on alcohol, caffeinated beverages, and cigarettes, especially close to bedtime. These can disrupt your sleep.  Do not overeat or eat spicy foods right before bedtime. This can lead to digestive discomfort that can make it hard for you to sleep.  Limit screen use before bedtime. This includes:  Watching TV.  Using your smartphone, tablet, and computer.  Stick to a routine. This can help you fall asleep faster. Try to do a quiet activity, brush your teeth, and go to bed at the same time each night.  Get out of bed if you are still awake after 15 minutes of trying to sleep. Keep the lights down, but try reading or doing a quiet activity. When you feel sleepy, go back to  bed.  Make sure that you drive carefully. Avoid driving if you feel very sleepy.  Keep all follow-up appointments as directed by your health care provider. This is important.   Please schedule a follow-up appointment with Debra Hodge, AGNP. Return in about 3 months (around 01/27/2017) for anxiety.   If you have any other questions or concerns, please feel free to call the clinic or send a message through MyChart. You may also schedule an earlier appointment if necessary.  You will receive a survey after today's visit either digitally by e-mail or paper by [employer] mail. Your experiences and feedback matter to us.  Please respond so we know how we are doing as we provide care for you.   Debra McardleLauren Yoltzin Ransom, DNP, AGNP-BC Adult Gerontology Nurse Practitioner [facility], [facility]

## 2016-10-28 NOTE — Assessment & Plan Note (Signed)
Improving w/ less disturbed sleep and improved appetite w/ some weight gain in last 4 weeks.  Pt feels she is at baseline with her ability to manage anxiety.  Less drowsiness w/ buspar and is taking twice daily most days.  Mirtazapine helping pt fall asleep faster and is contributing to improved appetite and weight gain.  Pt does not feel she needs any medication adjustments.  Plan: 1. Recommend further work for improving sleep hygiene.  Reduce dog interruptions, encourage dark room, 30-45 minutes or longer w/o electronic devices prior to onset of sleep.  Tips provided to patient 2. Continue buspirone 5 mg bid. 3. Continue mirtazapine 7.5 mg once at bedtime. Encouraged transition back to healthier eating w/ less calorie dense foods since appetite is improving w/ weight gain.  Reinforced pt that she is still at healthy weight. 4. Follow up 3 months.

## 2016-10-28 NOTE — Progress Notes (Signed)
I have reviewed this encounter including the documentation in this note and/or discussed this patient with the provider, Lauren Kennedy, AGPCNP-BC. I am certifying that I agree with the content of this note as supervising physician.  Derold Dorsch, DO [facility] 10/28/2016, 1:43 PM 

## 2016-10-28 NOTE — Progress Notes (Signed)
Subjective:    Patient ID: Debra Hodge, female    DOB: 02/16/1991, 26 y.o.   MRN: [id number]  Debra Ranllen Hickmon is a 26 y.o. female presenting on 10/28/2016 for Anxiety   HPI Anxiety and Depression Sleeping pattern a little better.  Falling asleep sooner. 11:30-12 am.  Is currently waking around 6-7 am. - Works late, dog awakens her early. - Not waking once asleep until dog bothers her. - slept in a room w/o windows on a vacation and slept 10 hours w/o interruption Appetite improving w/ some weight gain since last visit.  Depression screen [facility] 2/9 10/28/2016 09/17/2016 10/06/2015 09/01/2015 08/04/2015  Decreased Interest 1 1 1 1 3   Down, Depressed, Hopeless 1 1 1 1 2   PHQ - 2 Score 2 2 2 2 5   Altered sleeping 1 1 1 1 2   Tired, decreased energy 2 1 1 1 3   Change in appetite 2 3 1 1 3   Feeling bad or failure about yourself  1 1 2 1 2   Trouble concentrating 0 0 0 1 3  Moving slowly or fidgety/restless 0 0 0 0 3  Suicidal thoughts 0 0 0 0 0  PHQ-9 Score 8 8 7 7 21   Difficult doing work/chores - - Somewhat difficult Somewhat difficult Extremely dIfficult   GAD 7 : Generalized Anxiety Score 10/28/2016 09/17/2016 10/06/2015 09/01/2015  Nervous, Anxious, on Edge 3 3 3 2   Control/stop worrying 1 3 1 1   Worry too much - different things 1 2 1 1   Trouble relaxing 0 1 1 1   Restless 0 0 0 0  Easily annoyed or irritable 1 1 1 1   Afraid - awful might happen 0 2 0 0  Total GAD 7 Score 6 12 7 6   Anxiety Difficulty Not difficult at all Not difficult at all Not difficult at all Not difficult at all      Social History  Substance Use Topics  . Smoking status: Never Smoker  . Smokeless tobacco: Never Used  . Alcohol use 0.0 oz/week     Comment: ocaasionally    Review of Systems Per HPI unless specifically indicated above     Objective:    BP (!) 94/56 (BP Location: Right Arm, Patient Position: Sitting, Cuff Size: Small)   Pulse 93   Temp 97.8 F (36.6 C) (Oral)   Ht 5\' 3"  (1.6 m)    Wt 111 lb 12.8 oz (50.7 kg)   BMI 19.80 kg/m   Wt Readings from Last 3 Encounters:  10/28/16 111 lb 12.8 oz (50.7 kg)  09/17/16 105 lb 12.8 oz (48 kg)  10/06/15 124 lb 9.6 oz (56.5 kg)    Physical Exam  General - thin, well-appearing, NAD HEENT - Normocephalic, atraumatic Heart - RRR, no murmurs heard Lungs - Clear throughout all lobes, no wheezing, crackles, or rhonchi. Normal work of breathing. Extremeties - non-tender, no edema, cap refill < 2 seconds, peripheral pulses intact +2 bilaterally Skin - warm, dry Neuro - awake, alert, oriented x3, normal gait, mild resting tremor Psych - Normal mood and affect, normal behavior, conversation appropriate.   Results for orders placed or performed in visit on 08/04/15  TSH  Result Value Ref Range   TSH 0.84 mIU/L  Vitamin D (25 hydroxy)  Result Value Ref Range   Vit D, 25-Hydroxy 30 30 - 100 ng/mL  Vitamin B12  Result Value Ref Range   Vitamin B-12 427 200 - 1,100 pg/mL  Folate  Result Value Ref Range  Folate 14.0 >5.4 ng/mL  BASIC METABOLIC PANEL WITH GFR  Result Value Ref Range   Sodium 139 135 - 146 mmol/L   Potassium 4.3 3.5 - 5.3 mmol/L   Chloride 104 98 - 110 mmol/L   CO2 22 20 - 31 mmol/L   Glucose, Bld 74 65 - 99 mg/dL   BUN 10 7 - 25 mg/dL   Creat 1.61 0.96 - 0.45 mg/dL   Calcium 9.4 8.6 - 40.9 mg/dL   GFR, Est African American >89 >=60 mL/min   GFR, Est Non African American >89 >=60 mL/min      Assessment & Plan:   Problem List Items Addressed This Visit      Other   Anxiety and depression - Primary    Improving w/ less disturbed sleep and improved appetite w/ some weight gain in last 4 weeks.  Pt feels she is at baseline with her ability to manage anxiety.  Less drowsiness w/ buspar and is taking twice daily most days.  Mirtazapine helping pt fall asleep faster and is contributing to improved appetite and weight gain.  Pt does not feel she needs any medication adjustments.  Plan: 1. Recommend further work  for improving sleep hygiene.  Reduce dog interruptions, encourage dark room, 30-45 minutes or longer w/o electronic devices prior to onset of sleep.  Tips provided to patient 2. Continue buspirone 5 mg bid. 3. Continue mirtazapine 7.5 mg once at bedtime. Encouraged transition back to healthier eating w/ less calorie dense foods since appetite is improving w/ weight gain.  Reinforced pt that she is still at healthy weight. 4. Follow up 3 months.         No orders of the defined types were placed in this encounter.   Follow up plan: Return in about 3 months (around 01/27/2017) for anxiety.   Wilhelmina Mcardle, DNP, AGPCNP-BC Adult Gerontology Primary Care Nurse Practitioner [facility] 10/28/2016, 12:02 PM

## 2016-11-16 ENCOUNTER — Other Ambulatory Visit: Payer: Self-pay | Admitting: Nurse Practitioner

## 2016-11-16 DIAGNOSIS — F32A Depression, unspecified: Secondary | ICD-10-CM

## 2016-11-16 DIAGNOSIS — F329 Major depressive disorder, single episode, unspecified: Secondary | ICD-10-CM

## 2016-11-16 DIAGNOSIS — F419 Anxiety disorder, unspecified: Principal | ICD-10-CM

## 2016-11-17 MED ORDER — BUSPIRONE HCL 5 MG PO TABS: ORAL_TABLET | ORAL | 1 refills | Status: DC

## 2017-02-03 ENCOUNTER — Ambulatory Visit: Payer: 59 | Admitting: Nurse Practitioner

## 2017-02-28 ENCOUNTER — Encounter: Payer: Self-pay | Admitting: Nurse Practitioner

## 2017-02-28 ENCOUNTER — Other Ambulatory Visit: Payer: Self-pay

## 2017-02-28 ENCOUNTER — Ambulatory Visit (INDEPENDENT_AMBULATORY_CARE_PROVIDER_SITE_OTHER): Payer: 59 | Admitting: Nurse Practitioner

## 2017-02-28 VITALS — BP 116/67 | HR 85 | Ht 63.0 in | Wt 135.9 lb

## 2017-02-28 DIAGNOSIS — F329 Major depressive disorder, single episode, unspecified: Secondary | ICD-10-CM | POA: Diagnosis not present

## 2017-02-28 DIAGNOSIS — F419 Anxiety disorder, unspecified: Secondary | ICD-10-CM | POA: Diagnosis not present

## 2017-02-28 DIAGNOSIS — F32A Depression, unspecified: Secondary | ICD-10-CM

## 2017-02-28 MED ORDER — TRAZODONE HCL 50 MG PO TABS: 25.0000 mg | ORAL_TABLET | Freq: Every day | ORAL | 5 refills | Status: DC

## 2017-02-28 MED ORDER — BUSPIRONE HCL 5 MG PO TABS: ORAL_TABLET | ORAL | 1 refills | Status: DC

## 2017-02-28 NOTE — Patient Instructions (Addendum)
Debra Hodge, Thank you for coming in to clinic today.  1. Continue buspirone 5 mg twice daily.  Let me know if your transition to trazodone is not helping to decrease your anxiety.  2. STOP mirtazapine. - START trazodone 25-50 mg once daily.  Take 1/2 tablet for at least 7 days.  Then, increase to full tablet if needed for sleep or depression.  Please schedule a follow-up appointment with Wilhelmina McardleLauren Kincaid Tiger, AGNP. Return in about 3 months (around 05/29/2017) for anxiety and depression.  Always call sooner if you need to be seen.  If you have any other questions or concerns, please feel free to call the clinic or send a message through MyChart. You may also schedule an earlier appointment if necessary.  You will receive a survey after today's visit either digitally by e-mail or paper by [employer] mail. Your experiences and feedback matter to us.  Please respond so we know how we are doing as we provide care for you.   Wilhelmina McardleLauren Robbin Escher, DNP, AGNP-BC Adult Gerontology Nurse Practitioner [facility], [facility]

## 2017-02-28 NOTE — Progress Notes (Signed)
Subjective:    Patient ID: Debra Hodge, female    DOB: [birth year], 28 y.o.   MRN: [id number]  Debra Hodge is a 27 y.o. female presenting on 02/28/2017 for Anxiety and Depression   HPI Anxiety Pt reports today for followup of her anxiety.  She is currently taking buspirone 5 mg bid and mirtazapine 7.5 mg at bedtime.  She currently feels her panic disorder is well controlled, but has not seen quite the improvement she would like to continue to see.  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  She is noting much improved appetite with significant weight gain since last visit. Pt is still within normal weight, but has had 30 lb gain since July 2018 and since starting mirtazapine.  Pt states she needs to start eating healthier again.  Depression screen [facility] 2/9 02/28/2017 10/28/2016 09/17/2016 10/06/2015 09/01/2015  Decreased Interest 1 1 1 1 1   Down, Depressed, Hopeless 1 1 1 1 1   PHQ - 2 Score 2 2 2 2 2   Altered sleeping 0 1 1 1 1   Tired, decreased energy 1 2 1 1 1   Change in appetite 1 2 3 1 1   Feeling bad or failure about yourself  2 1 1 2 1   Trouble concentrating 1 0 0 0 1  Moving slowly or fidgety/restless 0 0 0 0 0  Suicidal thoughts 0 0 0 0 0  PHQ-9 Score 7 8 8 7 7   Difficult doing work/chores Not difficult at all - - Somewhat difficult Somewhat difficult   GAD 7 : Generalized Anxiety Score 02/28/2017 10/28/2016 09/17/2016 10/06/2015  Nervous, Anxious, on Edge 3 3 3 3   Control/stop worrying 1 1 3 1   Worry too much - different things 1 1 2 1   Trouble relaxing 1 0 1 1  Restless 0 0 0 0  Easily annoyed or irritable 1 1 1 1   Afraid - awful might happen 0 0 2 0  Total GAD 7 Score 7 6 12 7   Anxiety Difficulty Not difficult at all Not difficult at all Not difficult at all Not difficult at all    Social History   Tobacco Use  . Smoking status: Never Smoker  . Smokeless tobacco: Never Used  Substance Use Topics  .  Alcohol use: Yes    Alcohol/week: 0.0 oz    Comment: ocaasionally  . Drug use: No    Review of Systems Per HPI unless specifically indicated above     Objective:    BP 116/67 (BP Location: Right Arm, Patient Position: Sitting, Cuff Size: Normal)   Pulse 85   Ht 5\' 3"  (1.6 m)   Wt 135 lb 14.4 oz (61.6 kg)   BMI 24.07 kg/m   Wt Readings from Last 3 Encounters:  02/28/17 135 lb 14.4 oz (61.6 kg)  10/28/16 111 lb 12.8 oz (50.7 kg)  09/17/16 105 lb 12.8 oz (48 kg)    Physical Exam  General - healthy, well-appearing, NAD HEENT - Normocephalic, atraumatic Neck - supple, non-tender, no LAD, no thyromegaly Heart - RRR, no murmurs heard Lungs - Clear throughout all lobes, no wheezing, crackles, or rhonchi. Normal work of breathing. Extremeties - non-tender,  no edema, cap refill < 2 seconds, peripheral pulses intact +2 bilaterally Skin - warm, dry Neuro - awake, alert, oriented x3, normal gait Psych - Normal mood and affect, normal behavior   Results for orders placed or performed in visit on  08/04/15  TSH  Result Value Ref Range   TSH 0.84 mIU/L  Vitamin D (25 hydroxy)  Result Value Ref Range   Vit D, 25-Hydroxy 30 30 - 100 ng/mL  Vitamin B12  Result Value Ref Range   Vitamin B-12 427 200 - 1,100 pg/mL  Folate  Result Value Ref Range   Folate 14.0 >5.4 ng/mL  BASIC METABOLIC PANEL WITH GFR  Result Value Ref Range   Sodium 139 135 - 146 mmol/L   Potassium 4.3 3.5 - 5.3 mmol/L   Chloride 104 98 - 110 mmol/L   CO2 22 20 - 31 mmol/L   Glucose, Bld 74 65 - 99 mg/dL   BUN 10 7 - 25 mg/dL   Creat 4.090.66 8.110.50 - 9.141.10 mg/dL   Calcium 9.4 8.6 - 78.210.2 mg/dL   GFR, Est African American >89 >=60 mL/min   GFR, Est Non African American >89 >=60 mL/min      Assessment & Plan:   Problem List Items Addressed This Visit      Other   Anxiety and depression - Primary    Stable, but still remains at moderate level of symptoms and at baseline with her ability to manage anxiety.  Would  still like to have even better symptom control if possible.  Mirtazapine has improved appetite and pt is now at healthy weight.  Plan: 1. Continue work to improve sleep hygiene.   2. Continue buspirone 5 mg bid. 3. STOP mirtazapine.  START trazodone 25-50 mg once daily at bedtime. - Encouraged transition back to healthier eating w/ less calorie dense foods since appetite is improving w/ weight gain.  Reinforced pt that she is still at healthy weight. 4. Follow up 3 months.      Relevant Medications   traZODone (DESYREL) 50 MG tablet   busPIRone (BUSPAR) 5 MG tablet      Meds ordered this encounter  Medications  . traZODone (DESYREL) 50 MG tablet    Sig: Take 0.5-1 tablets (25-50 mg total) by mouth at bedtime.    Dispense:  30 tablet    Refill:  5    Order Specific Question:   Supervising Provider    Answer:   Smitty CordsKARAMALEGOS, ALEXANDER J [[id number]]  . busPIRone (BUSPAR) 5 MG tablet    Sig: Take 1 tablet in daytime and 2 tablets at night.    Dispense:  270 tablet    Refill:  1    Order Specific Question:   Supervising Provider    Answer:   Smitty CordsKARAMALEGOS, ALEXANDER J [[id number]]     Follow up plan: Return in about 3 months (around 05/29/2017) for anxiety and depression.  A total of 25 minutes was spent face-to-face with this patient. Greater than 50% of this time was spent in counseling and coordination of care with the patient regarding sleep hygiene, nutrition, medications.  Debra McardleLauren Jayline Kilburg, DNP, AGPCNP-BC Adult Gerontology Primary Care Nurse Practitioner [facility] 03/14/2017, 2:32 PM

## 2017-03-14 ENCOUNTER — Encounter: Payer: Self-pay | Admitting: Nurse Practitioner

## 2017-03-14 NOTE — Assessment & Plan Note (Signed)
Stable, but still remains at moderate level of symptoms and at baseline with her ability to manage anxiety.  Would still like to have even better symptom control if possible.  Mirtazapine has improved appetite and pt is now at healthy weight.  Plan: 1. Continue work to improve sleep hygiene.   2. Continue buspirone 5 mg bid. 3. STOP mirtazapine.  START trazodone 25-50 mg once daily at bedtime. - Encouraged transition back to healthier eating w/ less calorie dense foods since appetite is improving w/ weight gain.  Reinforced pt that she is still at healthy weight. 4. Follow up 3 months.

## 2017-03-24 ENCOUNTER — Other Ambulatory Visit: Payer: Self-pay | Admitting: Obstetrics and Gynecology

## 2017-03-25 ENCOUNTER — Telehealth: Payer: Self-pay

## 2017-03-25 NOTE — Telephone Encounter (Signed)
Pt sched annual for 2/25 and needs bc sent in to tie her over to appt.  [phone]  [facility] has already send refill in.

## 2017-04-18 ENCOUNTER — Encounter: Payer: Self-pay | Admitting: Obstetrics and Gynecology

## 2017-04-18 ENCOUNTER — Ambulatory Visit (INDEPENDENT_AMBULATORY_CARE_PROVIDER_SITE_OTHER): Payer: 59 | Admitting: Obstetrics and Gynecology

## 2017-04-18 VITALS — BP 110/70 | HR 84 | Ht 63.0 in | Wt 136.0 lb

## 2017-04-18 DIAGNOSIS — Z01419 Encounter for gynecological examination (general) (routine) without abnormal findings: Secondary | ICD-10-CM

## 2017-04-18 DIAGNOSIS — Z124 Encounter for screening for malignant neoplasm of cervix: Secondary | ICD-10-CM

## 2017-04-18 DIAGNOSIS — Z3041 Encounter for surveillance of contraceptive pills: Secondary | ICD-10-CM | POA: Diagnosis not present

## 2017-04-18 MED ORDER — NORETHIN ACE-ETH ESTRAD-FE 1.5-30 MG-MCG PO TABS: ORAL_TABLET | ORAL | 3 refills | Status: DC

## 2017-04-18 NOTE — Patient Instructions (Signed)
I value your feedback and entrusting us with your care. If you get a [facility] patient survey, I would appreciate you taking the time to let us know about your experience today. Thank you! 

## 2017-04-18 NOTE — Progress Notes (Signed)
PCP:  Galen Manila, NP   Chief Complaint  Patient presents with  . Annual Exam     HPI:      [name] is a 27 y.o. No obstetric history on file. who LMP was Patient's last menstrual period was 03/18/2017 (exact date)., presents today for her annual examination.  Her menses are regular every 28-30 days, lasting 3 days.  Dysmenorrhea none. She does not have intermenstrual bleeding.  Sex activity: single partner, contraception - OCP (estrogen/progesterone).  Last Pap: April 05, 2016  Results were: no abnormalities Hx of STDs: none  There is a FH of breast cancer in her pat aunt, genetic testing not indicated. There is no FH of ovarian cancer. The patient does not do self-breast exams.  Tobacco use: The patient denies current or previous tobacco use. Alcohol use: social drinker No drug use.  Exercise: moderately active  She does get adequate calcium and Vitamin D in her diet.   Past Medical History:  Diagnosis Date  . Depression     History reviewed. No pertinent surgical history.  Family History  Problem Relation Age of Onset  . Alcohol abuse Father   . Depression Father   . Cancer Maternal Uncle        colon   . Breast cancer Paternal Aunt 31  . Arthritis Maternal Grandmother   . Cancer Maternal Grandmother        lung cancer  . Diabetes Maternal Grandmother   . Arthritis Maternal Grandfather   . Diabetes Maternal Grandfather   . Alcohol abuse Paternal Grandmother     Social History   Socioeconomic History  . Marital status: Single    Spouse name: Not on file  . Number of children: Not on file  . Years of education: Not on file  . Highest education level: Not on file  Social Needs  . Financial resource strain: Not on file  . Food insecurity - worry: Not on file  . Food insecurity - inability: Not on file  . Transportation needs - medical: Not on file  . Transportation needs - non-medical: Not on file  Occupational History  .  Not on file  Tobacco Use  . Smoking status: Never Smoker  . Smokeless tobacco: Never Used  Substance and Sexual Activity  . Alcohol use: Yes    Alcohol/week: 0.0 oz    Comment: ocaasionally  . Drug use: No  . Sexual activity: Yes    Birth control/protection: Pill  Other Topics Concern  . Not on file  Social History Narrative  . Not on file    Current Meds  Medication Sig  . busPIRone (BUSPAR) 5 MG tablet Take 1 tablet in daytime and 2 tablets at night.  . norethindrone-ethinyl estradiol-iron (BLISOVI FE 1.5/30) 1.5-30 MG-MCG tablet TAKE EVERY TABLET BY MOUTH EVERY DAY  . traZODone (DESYREL) 50 MG tablet Take 0.5-1 tablets (25-50 mg total) by mouth at bedtime.  . [DISCONTINUED] BLISOVI FE 1.5/30 1.5-30 MG-MCG tablet TAKE EVERY TABLET BY MOUTH EVERY DAY     ROS:  Review of Systems  Constitutional: Negative for fatigue, fever and unexpected weight change.  Respiratory: Negative for cough, shortness of breath and wheezing.   Cardiovascular: Negative for chest pain, palpitations and leg swelling.  Gastrointestinal: Negative for blood in stool, constipation, diarrhea, nausea and vomiting.  Endocrine: Negative for cold intolerance, heat intolerance and polyuria.  Genitourinary: Negative for dyspareunia, dysuria, flank pain, frequency, genital sores, hematuria, menstrual problem, pelvic pain, urgency, vaginal  bleeding, vaginal discharge and vaginal pain.  Musculoskeletal: Negative for back pain, joint swelling and myalgias.  Skin: Negative for rash.  Neurological: Negative for dizziness, syncope, light-headedness, numbness and headaches.  Hematological: Negative for adenopathy.  Psychiatric/Behavioral: Negative for agitation, confusion, sleep disturbance and suicidal ideas. The patient is not nervous/anxious.      Objective: BP 110/70   Pulse 84   Ht 5\' 3"  (1.6 m)   Wt 136 lb (61.7 kg)   LMP 03/18/2017 (Exact Date)   BMI 24.09 kg/m    Physical Exam  Constitutional: She  is oriented to person, place, and time. She appears well-developed and well-nourished.  Genitourinary: Vagina normal and uterus normal. There is no rash or tenderness on the right labia. There is no rash or tenderness on the left labia. No erythema or tenderness in the vagina. No vaginal discharge found. Right adnexum does not display mass and does not display tenderness. Left adnexum does not display mass and does not display tenderness. Cervix does not exhibit motion tenderness or polyp. Uterus is not enlarged or tender.  Neck: Normal range of motion. No thyromegaly present.  Cardiovascular: Normal rate, regular rhythm and normal heart sounds.  No murmur heard. Pulmonary/Chest: Effort normal and breath sounds normal. Right breast exhibits no mass, no nipple discharge, no skin change and no tenderness. Left breast exhibits no mass, no nipple discharge, no skin change and no tenderness.  Abdominal: Soft. There is no tenderness. There is no guarding.  Musculoskeletal: Normal range of motion.  Neurological: She is alert and oriented to person, place, and time. No cranial nerve deficit.  Psychiatric: She has a normal mood and affect. Her behavior is normal.  Vitals reviewed.   Assessment/Plan: Encounter for annual routine gynecological examination  Cervical cancer screening - Plan: IGP, rfx Aptima HPV ASCU  Encounter for surveillance of contraceptive pills - OCP RF - Plan: norethindrone-ethinyl estradiol-iron (BLISOVI FE 1.5/30) 1.5-30 MG-MCG tablet  Meds ordered this encounter  Medications  . norethindrone-ethinyl estradiol-iron (BLISOVI FE 1.5/30) 1.5-30 MG-MCG tablet    Sig: TAKE EVERY TABLET BY MOUTH EVERY DAY    Dispense:  84 tablet    Refill:  3    ANNUAL DUE 2/19    Order Specific Question:   Supervising Provider    Answer:   Nadara MustardHARRIS, ROBERT P [[id number]]             GYN counsel adequate intake of calcium and vitamin D, diet and exercise     F/U  Return in about 1 year (around  04/18/2018).  Madeleyn Schwimmer B. Marquinn Meschke, PA-C 04/18/2017 1:51 PM

## 2017-04-19 LAB — IGP, RFX APTIMA HPV ASCU: PAP Smear Comment: 0

## 2017-06-06 ENCOUNTER — Ambulatory Visit: Payer: Self-pay | Admitting: Nurse Practitioner

## 2017-07-11 ENCOUNTER — Ambulatory Visit (INDEPENDENT_AMBULATORY_CARE_PROVIDER_SITE_OTHER): Payer: 59 | Admitting: Nurse Practitioner

## 2017-07-11 ENCOUNTER — Encounter: Payer: Self-pay | Admitting: Nurse Practitioner

## 2017-07-11 ENCOUNTER — Other Ambulatory Visit: Payer: Self-pay

## 2017-07-11 VITALS — BP 91/56 | HR 87 | Temp 98.8°F | Ht 63.0 in | Wt 128.2 lb

## 2017-07-11 DIAGNOSIS — F329 Major depressive disorder, single episode, unspecified: Secondary | ICD-10-CM | POA: Diagnosis not present

## 2017-07-11 DIAGNOSIS — F419 Anxiety disorder, unspecified: Secondary | ICD-10-CM

## 2017-07-11 DIAGNOSIS — F32A Depression, unspecified: Secondary | ICD-10-CM

## 2017-07-11 MED ORDER — BUSPIRONE HCL 5 MG PO TABS
ORAL_TABLET | ORAL | 1 refills | Status: DC
Start: 2017-07-11 — End: 2017-11-28

## 2017-07-11 MED ORDER — TRAZODONE HCL 50 MG PO TABS: 25.0000 mg | ORAL_TABLET | Freq: Every day | ORAL | 5 refills | Status: DC

## 2017-07-11 NOTE — Assessment & Plan Note (Signed)
Stable, but still remains at moderate level of symptoms and at baseline with her ability to manage anxiety.  Would still like to have even better symptom control if possible.  Mirtazapine has improved appetite and pt is now at healthy weight.  Denies SI/HI and has no plans to carry out if SI/HI arise.   Plan: 1. Continue work to improve stress management and anxiety coping skills.  Encouraged regular physical activity of 30 minutes 4-5 days per week.     2. Continue buspirone 5 mg bid. 3. Continue trazodone 50 mg once daily at bedtime. 4. Recommend counseling for coping skills.  Provided referral for [place] LCSW [employer].  Provided self [facility] for [facility].  Discussed payment at [facility] and [facility] can be negotiated based on income if needed. (Pt reports little coverage from insurance and high deductible health insurance plan as reasons she stopped this in past.)  Consider psychiatry for med management in future. 5.  Follow up 3 months.

## 2017-07-11 NOTE — Patient Instructions (Addendum)
Debra Hodge,   Thank you for coming in to clinic today.  1. Referral to Delight Ovens, LCSW at [facility].  2. Continue current medications.  3. PSYCHIATRY / THERAPY-COUSENLING (self referral) [facility]) [place] [address], [place], [place] [address] Phone: [phone]  [facility], available walk-in 9am-4pm M-F [address] [place], [place] [address] Hours: 9am - 4pm (M-F, walk in available) [phone]  Please schedule a follow-up appointment with Wilhelmina Mcardle, AGNP.  Return in about 3 months (around 10/11/2017).  If you have any other questions or concerns, please feel free to call the clinic or send a message through MyChart. You may also schedule an earlier appointment if necessary.  You will receive a survey after today's visit either digitally by e-mail or paper by [employer]. Your experiences and feedback matter to [place].  Please respond so we know how we are doing as we provide care for you.   Wilhelmina Mcardle, DNP, AGNP-BC Adult Gerontology Nurse Practitioner [facility]

## 2017-07-11 NOTE — Progress Notes (Signed)
Subjective:    Patient ID: Debra Hodge, female    DOB: [birth year], 27 y.o.   MRN: [id number]  Debra Hodge is a 27 y.o. female presenting on 07/11/2017 for Anxiety and Depression   HPI  Anxiety Patient reports stable moods since last visit.  Stable on trazodone 50 mg at bedtime and buspirone 5 mg twice daily.  Patient reports no side effects from these medications. - Is still working from home.  Pt now states being home all the time is isolating socially.  Initially, working from home was helpful stress management but is now becoming a hindrance to improving mental health per patient. - Is active with friends once weekly board game night on weekends.  No other regular social interaction. - Sleep quality is good with trazodone.  No daytime sleepiness reported. - Patient works with conflict resolution and in HR department.  Patient admits she needs to improve stress management and coping skills outside medication. - Counseling in past, but is limited financially to this resource.  GAD 7 : Generalized Anxiety Score 07/11/2017 02/28/2017 10/28/2016 09/17/2016  Nervous, Anxious, on Edge Control/stop worrying Worry too much - different things Trouble relaxing 1 1 0 1  Restless 0 0 0 0  Easily annoyed or irritable Afraid - awful might happen 0 0 0 2  Total GAD 7 Score Anxiety Difficulty Somewhat difficult Not difficult at all Not difficult at all Not difficult at all    Depression screen [facility] 2/9 07/11/2017 02/28/2017 10/28/2016 09/17/2016 10/06/2015  Decreased Interest Down, Depressed, Hopeless PHQ - 2 Score Altered sleeping 1 0 Tired, decreased energy Change in appetite Feeling bad or failure about yourself  Trouble concentrating 1 1 0 0 0  Moving slowly or fidgety/restless 0 0 0 0 0  Suicidal thoughts 0 0 0 0 0  PHQ-9 Score Difficult doing work/chores  Somewhat difficult Not difficult at all - - Somewhat difficult     Social History   Tobacco Use  . Smoking status: Never Smoker  . Smokeless tobacco: Never Used  Substance Use Topics  . Alcohol use: Yes    Alcohol/week: 0.0 oz    Comment: ocaasionally  . Drug use: No    Review of Systems  Constitutional: Negative for fatigue and unexpected weight change.  Respiratory: Negative for shortness of breath.   Cardiovascular: Negative for chest pain and palpitations.  Gastrointestinal: Negative for abdominal pain, constipation, diarrhea, nausea and vomiting.  Endocrine: Negative for polydipsia.  Musculoskeletal: Negative for back pain and myalgias.  Neurological: Negative for dizziness, seizures and headaches.  Psychiatric/Behavioral: Positive for sleep disturbance. Negative for hallucinations, self-injury and suicidal ideas. The patient is nervous/anxious.    Per HPI unless specifically indicated above     Objective:    BP (!) 91/56 (BP Location: Right Arm, Patient Position: Sitting, Cuff Size: Normal)   Pulse 87   Temp 98.8 F (37.1 C) (Other (Comment))   Ht  (1.6 m)   Wt 128 lb 3.2 oz (58.2 kg)   BMI 22.71 kg/m   Wt Readings from Last 3 Encounters:  07/11/17 128  lb 3.2 oz (58.2 kg)  04/18/17 136 lb (61.7 kg)  02/28/17 135 lb 14.4 oz (61.6 kg)    Physical Exam  Constitutional: She is oriented to person, place, and time. She appears well-developed and well-nourished. No distress.  HENT:  Head: Normocephalic and atraumatic.  Cardiovascular: Normal rate, regular rhythm, S1 normal, S2 normal, normal heart sounds and intact distal pulses.  Pulmonary/Chest: Effort normal and breath sounds normal. No respiratory distress.  Neurological: She is alert and oriented to person, place, and time.  Skin: Skin is warm and dry.  Psychiatric: She has a normal mood and affect. Her speech is normal and behavior is normal. Judgment and thought content normal. Cognition and memory are  normal.  Vitals reviewed.   Results for orders placed or performed in visit on 04/18/17  HM PAP SMEAR  Result Value Ref Range   HM Pap smear normal   IGP, rfx Aptima HPV ASCU  Result Value Ref Range   DIAGNOSIS: Comment    Specimen adequacy: Comment    Clinician Provided ICD10 Comment    Performed by: Comment    PAP Smear Comment .    Note: Comment    Test Methodology Comment    PAP Reflex Comment       Assessment & Plan:   Problem List Items Addressed This Visit      Other   Anxiety and depression - Primary    Stable, but still remains at moderate level of symptoms and at baseline with her ability to manage anxiety.  Would still like to have even better symptom control if possible.  Mirtazapine has improved appetite and pt is now at healthy weight.  Denies SI/HI and has no plans to carry out if SI/HI arise.   Plan: 1. Continue work to improve stress management and anxiety coping skills.  Encouraged regular physical activity of 30 minutes 4-5 days per week.     2. Continue buspirone 5 mg bid. 3. Continue trazodone 50 mg once daily at bedtime. 4. Recommend counseling for coping skills.  Provided referral for [place] LCSW [employer].  Provided self [facility]  Discussed [facility] at [facility] and [facility] can be negotiated based on income if needed. (Pt reports little coverage from insurance and high deductible health insurance plan as reasons she stopped this in past.)  Consider psychiatry for med management in future. 5.  Follow up 3 months.      Relevant Medications   busPIRone (BUSPAR) 5 MG tablet   traZODone (DESYREL) 50 MG tablet   Other Relevant Orders   Ambulatory referral to Social Work      Meds ordered this encounter  Medications  . busPIRone (BUSPAR) 5 MG tablet    Sig: Take 1 tablet in daytime and 2 tablets at night.    Dispense:  270 tablet    Refill:  1    Order Specific Question:   Supervising Provider    Answer:   Smitty Cords [[id number]]  . traZODone (DESYREL) 50 MG tablet    Sig: Take 0.5-1 tablets (25-50 mg total) by mouth at bedtime.    Dispense:  30 tablet    Refill:  5    Order Specific Question:   Supervising Provider    Answer:   Smitty Cords [[id number]]    Follow up plan: Return in about 3 months (around 10/11/2017).  Wilhelmina Mcardle, DNP, AGPCNP-BC Adult Gerontology Primary Care Nurse Practitioner [facility] 07/11/2017, 12:40 PM

## 2017-11-28 ENCOUNTER — Ambulatory Visit: Payer: BLUE CROSS/BLUE SHIELD | Admitting: Nurse Practitioner

## 2017-11-28 ENCOUNTER — Encounter: Payer: Self-pay | Admitting: Nurse Practitioner

## 2017-11-28 ENCOUNTER — Other Ambulatory Visit: Payer: Self-pay

## 2017-11-28 VITALS — BP 90/61 | HR 81 | Temp 98.8°F | Ht 63.0 in | Wt 125.4 lb

## 2017-11-28 DIAGNOSIS — Z23 Encounter for immunization: Secondary | ICD-10-CM | POA: Diagnosis not present

## 2017-11-28 DIAGNOSIS — F329 Major depressive disorder, single episode, unspecified: Secondary | ICD-10-CM | POA: Diagnosis not present

## 2017-11-28 DIAGNOSIS — F32A Depression, unspecified: Secondary | ICD-10-CM

## 2017-11-28 DIAGNOSIS — F419 Anxiety disorder, unspecified: Secondary | ICD-10-CM

## 2017-11-28 MED ORDER — TRAZODONE HCL 50 MG PO TABS: 50.0000 mg | ORAL_TABLET | Freq: Every day | ORAL | 5 refills | Status: DC

## 2017-11-28 MED ORDER — BUSPIRONE HCL 5 MG PO TABS: ORAL_TABLET | ORAL | 1 refills | Status: DC

## 2017-11-28 NOTE — Progress Notes (Signed)
Subjective:    Patient ID: Debra Hodge, female    DOB: 06-18-1990, 27 y.o.   MRN: [id number]  Debra Hodge is a 27 y.o. female presenting on 11/28/2017 for Anxiety and Depression   HPI Anxiety and Depression Generally feels well with anxiety and depression.  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  Does feel she has adequate resources there.  Still has daily customer interaction, but overall interactions are less stressful. - Anxiety impacts on life: mostly work stressors or feeling like not doing anything after work because of anxiety. - Has not kept regular physical activity/exercise routine.  GAD 7 : Generalized Anxiety Score 11/28/2017 07/11/2017 02/28/2017 10/28/2016  Nervous, Anxious, on Edge 2 2 3 3   Control/stop worrying 1 1 1 1   Worry too much - different things 1 1 1 1   Trouble relaxing 1 1 1  0  Restless 0 0 0 0  Easily annoyed or irritable 0 1 1 1   Afraid - awful might happen 0 0 0 0  Total GAD 7 Score 5 6 7 6   Anxiety Difficulty Somewhat difficult Somewhat difficult Not difficult at all Not difficult at all     Social History   Tobacco Use  . Smoking status: Never Smoker  . Smokeless tobacco: Never Used  Substance Use Topics  . Alcohol use: Yes    Alcohol/week: 0.0 standard drinks    Comment: occasionally  . Drug use: No    Review of Systems Per HPI unless specifically indicated above     Objective:    BP 90/61 (BP Location: Left Arm, Patient Position: Sitting, Cuff Size: Normal)   Pulse 81   Temp 98.8 F (37.1 C) (Oral)   Ht 5\' 3"  (1.6 m)   Wt 125 lb 6.4 oz (56.9 kg)   BMI 22.21 kg/m   Wt Readings from Last 3 Encounters:  11/28/17 125 lb 6.4 oz (56.9 kg)  07/11/17 128 lb 3.2 oz (58.2 kg)  04/18/17 136 lb (61.7 kg)    Physical Exam  Constitutional: She is oriented to person, place, and time. She appears well-developed and well-nourished. No distress.  HENT:   Head: Normocephalic and atraumatic.  Cardiovascular: Normal rate, regular rhythm, S1 normal, S2 normal, normal heart sounds and intact distal pulses.  Pulmonary/Chest: Effort normal and breath sounds normal. No respiratory distress.  Neurological: She is alert and oriented to person, place, and time.  Skin: Skin is warm and dry. Capillary refill takes less than 2 seconds.  Psychiatric: She has a normal mood and affect. Her behavior is normal. Judgment and thought content normal.  Vitals reviewed.    Results for orders placed or performed in visit on 04/18/17  HM PAP SMEAR  Result Value Ref Range   HM Pap smear normal   IGP, rfx Aptima HPV ASCU  Result Value Ref Range   DIAGNOSIS: Comment    Specimen adequacy: Comment    Clinician Provided ICD10 Comment    Performed by: Comment    PAP Smear Comment .    Note: Comment    Test Methodology Comment    PAP Reflex Comment       Assessment & Plan:   Problem List Items Addressed This Visit      Other   Anxiety and depression   Relevant Medications   traZODone (DESYREL) 50 MG tablet   busPIRone (BUSPAR) 5 MG tablet  Other Visit Diagnoses    Need for immunization against influenza    -  Primary   Relevant Orders   Flu Vaccine QUAD 6+ mos PF IM (Fluarix Quad PF) (Completed)      Meds ordered this encounter  Medications  . traZODone (DESYREL) 50 MG tablet    Sig: Take 1 tablet (50 mg total) by mouth at bedtime.    Dispense:  30 tablet    Refill:  5    Order Specific Question:   Supervising Provider    Answer:   Smitty Cords [[id number]]  . busPIRone (BUSPAR) 5 MG tablet    Sig: Take 1.5 tablets in daytime and 2 tablets at night.    Dispense:  315 tablet    Refill:  1    Order Specific Question:   Supervising Provider    Answer:   Smitty Cords [[id number]]    Follow up plan: Return in about 6 months (around 05/30/2018) for anxiety.  Wilhelmina Mcardle, DNP, AGPCNP-BC Adult Gerontology Primary Care Nurse  Practitioner [facility] 11/28/2017, 10:05 AM

## 2017-11-28 NOTE — Patient Instructions (Addendum)
Debra Hodge,   Thank you for coming in to clinic today.  1. Increase, make physical activity more regular for stress management.  2.  Increase daytime buspirone to 7.5 mg and continue 10 mg at bedtime. Send a mychart message if 7.5mg  is not working.  Please schedule a follow-up appointment with Debra Hodge, AGNP.  Return in about 6 months (around 05/30/2018) for anxiety.  If you have any other questions or concerns, please feel free to call the clinic or send a message through MyChart. You may also schedule an earlier appointment if necessary.  You will receive a survey after today's visit either digitally by e-mail or paper by [employer]. Your experiences and feedback matter to [place].  Please respond so we know how we are doing as we provide care for you.  Debra Mcardle, DNP, AGNP-BC Adult Gerontology Nurse Practitioner [facility], [facility]

## 2018-01-14 ENCOUNTER — Encounter: Payer: Self-pay | Admitting: Nurse Practitioner

## 2018-01-14 NOTE — Assessment & Plan Note (Addendum)
Improved and stable.  Mild level of symptoms and at baseline with her ability to manage anxiety.  Denies SI/HI and has no plans to carry out if SI/HI arise.   Plan: 1. Continue work to improve stress management and anxiety coping skills.  Encouraged regular physical activity of 30 minutes 4-5 days per week.     2. Continue buspirone 5 mg bid. 3. Continue trazodone 50 mg once daily at bedtime. 4. Recommend counseling for coping skills.   5.  Follow up 6 months or sooner if needed.

## 2018-02-08 ENCOUNTER — Encounter: Payer: Self-pay | Admitting: Nurse Practitioner

## 2018-02-08 ENCOUNTER — Ambulatory Visit (INDEPENDENT_AMBULATORY_CARE_PROVIDER_SITE_OTHER): Payer: BLUE CROSS/BLUE SHIELD | Admitting: Nurse Practitioner

## 2018-02-08 ENCOUNTER — Other Ambulatory Visit: Payer: Self-pay

## 2018-02-08 VITALS — BP 109/70 | HR 94 | Temp 99.4°F | Ht 63.0 in | Wt 125.2 lb

## 2018-02-08 DIAGNOSIS — R6889 Other general symptoms and signs: Secondary | ICD-10-CM | POA: Diagnosis not present

## 2018-02-08 DIAGNOSIS — R6883 Chills (without fever): Secondary | ICD-10-CM | POA: Diagnosis not present

## 2018-02-08 LAB — POCT INFLUENZA A/B
Influenza A, POC: NEGATIVE
Influenza B, POC: NEGATIVE

## 2018-02-08 MED ORDER — BALOXAVIR MARBOXIL(40 MG DOSE) 2 X 20 MG PO TBPK: 40.0000 mg | ORAL_TABLET | Freq: Once | ORAL | 0 refills | Status: AC

## 2018-02-08 NOTE — Patient Instructions (Signed)
Debra Hodge,   Thank you for coming in to clinic today.  1. Your flu test was NEGATIVE.  It is possible you can still have the flu with a negative test, otherwise it could be a different virus causing your symptoms. - Start Xofluza 40 mg once. Take 2 20mg  tablets at the same time today.   - Wash hands and cover cough very well to avoid spread of infection - For symptom control:      - Take Ibuprofen / Advil 400-600mg  every 6-8 hours as needed for fever / muscle aches.  You may also take Tylenol 500-1000mg  per dose every 6-8 hours or 3 times a day.  You can alternate dosing and take both in the same day.      - Do not take more than 3,000 mg acetaminophen (Tylenol) in a day.      - Start OTC Mucinex-DM for cough and congestion for up to 7 days. - Improve hydration with plenty of clear fluids.  Drink up to 8 glasses of water / fluids each day.  If significant worsening with poor fluid intake, worsening fever, difficulty breathing due to coughing, worsening body aches, weakness, or other more concerning symptoms difficulty breathing you can seek treatment at Emergency Department. If your flu symptoms have improved and then get worse several days to a week later with concerns for bronchitis, productive cough, fever, and chills we may need to check for possible pneumonia that can occur after the flu.  Please schedule a follow-up appointment with Wilhelmina McardleLauren Kyley Solow, AGNP in 1-2 weeks as needed if worsening from Flu / Bronchitis   If you have any other questions or concerns, please feel free to call the clinic or send a message through MyChart. You may also schedule an earlier appointment if necessary.  You will receive a survey after today's visit either digitally by e-mail or paper by [employer] mail. Your experiences and feedback matter to us.  Please respond so we know how we are doing as we provide care for you.   Wilhelmina McardleLauren Teva Bronkema, DNP, AGNP-BC Adult Gerontology Nurse Practitioner [facility], [facility]

## 2018-02-08 NOTE — Progress Notes (Signed)
Subjective:    Patient ID: Debra Hodge, female    DOB: 12/25/1990, 27 y.o.   MRN: [id number]  Debra Hodge is a 27 y.o. female presenting on 02/08/2018 for Flu Vaccine (bodyaches, fatigue, runny nose and mild cough x symptoms started yesterday. Pt concern, because she was exposed to the flu.)   HPI Flu-like symptoms Sick last Monday, returned this Monday but has had new symptoms.  Coworker was positive for flu.  - some diarrhea yesterday - Poor appetite, some nausea - Is getting enough oral intake.  Social History   Tobacco Use  . Smoking status: Never Smoker  . Smokeless tobacco: Never Used  Substance Use Topics  . Alcohol use: Yes    Alcohol/week: 0.0 standard drinks    Comment: occasionally  . Drug use: No    Review of Systems Per HPI unless specifically indicated above     Objective:    BP 109/70 (BP Location: Right Arm, Patient Position: Sitting, Cuff Size: Normal)   Pulse 94   Temp 99.4 F (37.4 C) (Oral)   Ht 5\' 3"  (1.6 m)   Wt 125 lb 3.2 oz (56.8 kg)   BMI 22.18 kg/m   Wt Readings from Last 3 Encounters:  02/08/18 125 lb 3.2 oz (56.8 kg)  11/28/17 125 lb 6.4 oz (56.9 kg)  07/11/17 128 lb 3.2 oz (58.2 kg)    Physical Exam Vitals signs reviewed.  Constitutional:      Appearance: She is well-developed.  HENT:     Head: Normocephalic and atraumatic.     Right Ear: Hearing, tympanic membrane, ear canal and external ear normal.     Left Ear: Hearing, tympanic membrane, ear canal and external ear normal.     Nose: Mucosal edema and rhinorrhea present.     Right Sinus: Maxillary sinus tenderness and frontal sinus tenderness present.     Left Sinus: Maxillary sinus tenderness and frontal sinus tenderness present.     Mouth/Throat:     Lips: Pink.     Mouth: Mucous membranes are moist.     Pharynx: Uvula midline. Pharyngeal swelling and posterior oropharyngeal erythema (mildly injected) present. No oropharyngeal exudate (clear secretions) or uvula  swelling.     Tonsils: No tonsillar exudate. Swelling: 1+ on the right. 1+ on the left.  Eyes:     General: Lids are normal.        Right eye: No discharge.        Left eye: No discharge.     Conjunctiva/sclera: Conjunctivae normal.     Pupils: Pupils are equal, round, and reactive to light.  Neck:     Musculoskeletal: Full passive range of motion without pain, normal range of motion and neck supple.  Cardiovascular:     Rate and Rhythm: Normal rate and regular rhythm.     Pulses: Normal pulses.     Heart sounds: Normal heart sounds, S1 normal and S2 normal.  Pulmonary:     Effort: Pulmonary effort is normal. No respiratory distress.     Breath sounds: Normal breath sounds.  Musculoskeletal:     Right lower leg: No edema.     Left lower leg: No edema.  Lymphadenopathy:     Cervical: Cervical adenopathy present.     Right cervical: Superficial cervical adenopathy and deep cervical adenopathy present.     Left cervical: Superficial cervical adenopathy and deep cervical adenopathy present.  Skin:    General: Skin is warm and dry.     Capillary Refill:  Capillary refill takes less than 2 seconds.  Neurological:     General: No focal deficit present.     Mental Status: She is alert.     GCS: GCS eye subscore is 4. GCS verbal subscore is 5. GCS motor subscore is 6.  Psychiatric:        Attention and Perception: Attention normal.        Mood and Affect: Mood normal.        Behavior: Behavior normal. Behavior is cooperative.    Results for orders placed or performed in visit on 02/08/18  POCT Influenza A/B  Result Value Ref Range   Influenza A, POC Negative Negative   Influenza B, POC Negative Negative      Assessment & Plan:   Problem List Items Addressed This Visit    None    Visit Diagnoses    Flu-like symptoms    -  Primary   Relevant Medications   Baloxavir Marboxil,40 MG Dose, (XOFLUZA) 2 x 20 MG TBPK   Chills       Relevant Medications   Baloxavir Marboxil,40 MG  Dose, (XOFLUZA) 2 x 20 MG TBPK   Other Relevant Orders   POCT Influenza A/B (Completed)      Clinically diagnosed influenza despite negative rapid flu test today, concern for flu still due to significant known exposure to positive influenza with a coworker - Duration x 1 days, without complication. Tolerating PO and well hydrated - No other focal findings of infection today - S/p influenza vaccine this season  Plan: 1. Start Xofluza 40 mg once.  Take two 20 mg capsules once today for one dose. - Will also provide additional rx for patient's spouse due to close contact if he presents with symptoms. 2. Supportive care as advised with NSAID / Tylenol PRN fever/myalgias, improve hydration, may take OTC Cold/Flu meds 3. Return criteria given if significant worsening, consider post-influenza complications, otherwise follow-up if needed    Meds ordered this encounter  Medications  . Baloxavir Marboxil,40 MG Dose, (XOFLUZA) 2 x 20 MG TBPK    Sig: Take 40 mg by mouth once for 1 dose.    Dispense:  2 each    Refill:  0    Order Specific Question:   Supervising Provider    Answer:   Smitty Cords [[id number]]    Follow up plan: Return 1-2 weeks if symptoms worsen or fail to improve.  Debra Mcardle, DNP, AGPCNP-BC Adult Gerontology Primary Care Nurse Practitioner [facility] 02/08/2018, 9:17 AM

## 2018-03-23 ENCOUNTER — Other Ambulatory Visit: Payer: Self-pay

## 2018-03-23 ENCOUNTER — Other Ambulatory Visit: Payer: Self-pay | Admitting: Obstetrics and Gynecology

## 2018-03-23 DIAGNOSIS — Z3041 Encounter for surveillance of contraceptive pills: Secondary | ICD-10-CM

## 2018-03-23 MED ORDER — NORETHIN ACE-ETH ESTRAD-FE 1.5-30 MG-MCG PO TABS: ORAL_TABLET | ORAL | 0 refills | Status: DC

## 2018-03-23 NOTE — Telephone Encounter (Signed)
Pt scheduled appt 3/9th and needs refill of bc until then.  [phone] Mailbox full.

## 2018-05-01 ENCOUNTER — Other Ambulatory Visit (HOSPITAL_COMMUNITY)
Admission: RE | Admit: 2018-05-01 | Discharge: 2018-05-01 | Disposition: A | Discharge status: Home / Self Care | Payer: BLUE CROSS/BLUE SHIELD | Source: Ambulatory Visit | Attending: Obstetrics and Gynecology | Admitting: Obstetrics and Gynecology

## 2018-05-01 ENCOUNTER — Ambulatory Visit (INDEPENDENT_AMBULATORY_CARE_PROVIDER_SITE_OTHER): Payer: BLUE CROSS/BLUE SHIELD | Admitting: Obstetrics and Gynecology

## 2018-05-01 ENCOUNTER — Encounter: Payer: Self-pay | Admitting: Obstetrics and Gynecology

## 2018-05-01 VITALS — BP 100/58 | HR 86 | Ht 63.0 in | Wt 124.0 lb

## 2018-05-01 DIAGNOSIS — Z124 Encounter for screening for malignant neoplasm of cervix: Secondary | ICD-10-CM

## 2018-05-01 DIAGNOSIS — Z3041 Encounter for surveillance of contraceptive pills: Secondary | ICD-10-CM

## 2018-05-01 DIAGNOSIS — Z803 Family history of malignant neoplasm of breast: Secondary | ICD-10-CM

## 2018-05-01 DIAGNOSIS — Z01419 Encounter for gynecological examination (general) (routine) without abnormal findings: Secondary | ICD-10-CM

## 2018-05-01 MED ORDER — NORETHIN ACE-ETH ESTRAD-FE 1.5-30 MG-MCG PO TABS: ORAL_TABLET | ORAL | 3 refills | Status: DC

## 2018-05-01 NOTE — Patient Instructions (Signed)
I value your feedback and entrusting us with your care. If you get a  patient survey, I would appreciate you taking the time to let us know about your experience today. Thank you! 

## 2018-05-01 NOTE — Progress Notes (Signed)
PCP:  Galen Manila, NP   Chief Complaint  Patient presents with  . Gynecologic Exam     HPI:      [name] is a 29 y.o. No obstetric history on file. who LMP was Patient's last menstrual period was 04/17/2018 (approximate)., presents today for her annual examination.  Her menses are regular every 28-30 days, lasting 3 days.  Dysmenorrhea mild. She does not have intermenstrual bleeding.  Sex activity: single partner, contraception - OCP (estrogen/progesterone).  Last Pap: 04/18/17  Results were: no abnormalities Hx of STDs: none  There is a FH of breast cancer in her pat aunt, genetic testing not done. There is no FH of ovarian cancer. The patient does not do monthly self-breast exams.  Tobacco use: The patient denies current or previous tobacco use. Alcohol use: rare No drug use.  Exercise: moderately active  She does get adequate calcium and Vitamin D in her diet.   Past Medical History:  Diagnosis Date  . Depression     History reviewed. No pertinent surgical history.  Family History  Problem Relation Age of Onset  . Alcohol abuse Father   . Depression Father   . Cancer Maternal Uncle        colon   . Breast cancer Paternal Aunt 40  . Arthritis Maternal Grandmother   . Cancer Maternal Grandmother        lung cancer  . Diabetes Maternal Grandmother   . Arthritis Maternal Grandfather   . Diabetes Maternal Grandfather   . Alcohol abuse Paternal Grandmother     Social History   Socioeconomic History  . Marital status: Single    Spouse name: Not on file  . Number of children: Not on file  . Years of education: Not on file  . Highest education level: Not on file  Occupational History  . Not on file  Social Needs  . Financial resource strain: Not on file  . Food insecurity:    Worry: Not on file    Inability: Not on file  . Transportation needs:    Medical: Not on file    Non-medical: Not on file  Tobacco Use  . Smoking status:  Never Smoker  . Smokeless tobacco: Never Used  Substance and Sexual Activity  . Alcohol use: Yes    Alcohol/week: 0.0 standard drinks    Comment: occasionally  . Drug use: No  . Sexual activity: Yes    Birth control/protection: Pill  Lifestyle  . Physical activity:    Days per week: Not on file    Minutes per session: Not on file  . Stress: Not on file  Relationships  . Social connections:    Talks on phone: Not on file    Gets together: Not on file    Attends religious service: Not on file    Active member of club or organization: Not on file    Attends meetings of clubs or organizations: Not on file    Relationship status: Not on file  . Intimate partner violence:    Fear of current or ex partner: Not on file    Emotionally abused: Not on file    Physically abused: Not on file    Forced sexual activity: Not on file  Other Topics Concern  . Not on file  Social History Narrative  . Not on file    Current Meds  Medication Sig  . busPIRone (BUSPAR) 5 MG tablet Take 1.5 tablets in daytime and  2 tablets at night.  . norethindrone-ethinyl estradiol-iron (BLISOVI FE 1.5/30) 1.5-30 MG-MCG tablet TAKE EVERY TABLET BY MOUTH EVERY DAY  . traZODone (DESYREL) 50 MG tablet Take 1 tablet (50 mg total) by mouth at bedtime.  . [DISCONTINUED] norethindrone-ethinyl estradiol-iron (BLISOVI FE 1.5/30) 1.5-30 MG-MCG tablet TAKE EVERY TABLET BY MOUTH EVERY DAY     ROS:  Review of Systems  Constitutional: Positive for fatigue. Negative for fever and unexpected weight change.  Respiratory: Negative for cough, shortness of breath and wheezing.   Cardiovascular: Negative for chest pain, palpitations and leg swelling.  Gastrointestinal: Negative for blood in stool, constipation, diarrhea, nausea and vomiting.  Endocrine: Negative for cold intolerance, heat intolerance and polyuria.  Genitourinary: Negative for dyspareunia, dysuria, flank pain, frequency, genital sores, hematuria, menstrual  problem, pelvic pain, urgency, vaginal bleeding, vaginal discharge and vaginal pain.  Musculoskeletal: Positive for arthralgias. Negative for back pain, joint swelling and myalgias.  Skin: Negative for rash.  Neurological: Negative for dizziness, syncope, light-headedness, numbness and headaches.  Hematological: Negative for adenopathy.  Psychiatric/Behavioral: Negative for agitation, confusion, sleep disturbance and suicidal ideas. The patient is not nervous/anxious.      Objective: BP (!) 100/58   Pulse 86   Ht 5\' 3"  (1.6 m)   Wt 124 lb (56.2 kg)   LMP 04/17/2018 (Approximate)   BMI 21.97 kg/m    Physical Exam Constitutional:      Appearance: She is well-developed.  Genitourinary:     Vulva, vagina, uterus, right adnexa and left adnexa normal.     No vulval lesion or tenderness noted.     No vaginal discharge, erythema or tenderness.     No cervical motion tenderness or polyp.     Uterus is not enlarged or tender.     No right or left adnexal mass present.     Right adnexa not tender.     Left adnexa not tender.  Neck:     Musculoskeletal: Normal range of motion.     Thyroid: No thyromegaly.  Cardiovascular:     Rate and Rhythm: Normal rate and regular rhythm.     Heart sounds: Normal heart sounds. No murmur.  Pulmonary:     Effort: Pulmonary effort is normal.     Breath sounds: Normal breath sounds.  Chest:     Breasts:        Right: No mass, nipple discharge, skin change or tenderness.        Left: No mass, nipple discharge, skin change or tenderness.  Abdominal:     Palpations: Abdomen is soft.     Tenderness: There is no abdominal tenderness. There is no guarding.  Musculoskeletal: Normal range of motion.  Neurological:     General: No focal deficit present.     Mental Status: She is alert and oriented to person, place, and time.     Cranial Nerves: No cranial nerve deficit.  Skin:    General: Skin is warm and dry.  Psychiatric:        Mood and Affect:  Mood normal.        Behavior: Behavior normal.        Thought Content: Thought content normal.        Judgment: Judgment normal.  Vitals signs reviewed.     Assessment/Plan: Encounter for annual routine gynecological examination  Cervical cancer screening - Plan: Cytology - PAP  Encounter for surveillance of contraceptive pills - OCP RF - Plan: norethindrone-ethinyl estradiol-iron (BLISOVI FE 1.5/30) 1.5-30 MG-MCG tablet  Family history of breast cancer - MyRisk testing discussed. Pt to f/u if desires.  Meds ordered this encounter  Medications  . norethindrone-ethinyl estradiol-iron (BLISOVI FE 1.5/30) 1.5-30 MG-MCG tablet    Sig: TAKE EVERY TABLET BY MOUTH EVERY DAY    Dispense:  84 tablet    Refill:  3    Order Specific Question:   Supervising Provider    Answer:   Nadara Mustard [[id number]]             GYN counsel adequate intake of calcium and vitamin D, diet and exercise     F/U  Return in about 1 year (around 05/01/2019).   B. , PA-C 05/01/2018 8:56 AM

## 2018-05-02 LAB — CYTOLOGY - PAP: Diagnosis: NEGATIVE

## 2018-05-03 ENCOUNTER — Encounter: Payer: Self-pay | Admitting: Obstetrics and Gynecology

## 2018-05-06 ENCOUNTER — Other Ambulatory Visit: Payer: Self-pay

## 2018-05-06 ENCOUNTER — Ambulatory Visit
Admission: EM | Admit: 2018-05-06 | Discharge: 2018-05-06 | Disposition: A | Discharge status: Home / Self Care | Payer: BLUE CROSS/BLUE SHIELD | Attending: Family Medicine | Admitting: Family Medicine

## 2018-05-06 ENCOUNTER — Encounter: Payer: Self-pay | Admitting: Gynecology

## 2018-05-06 DIAGNOSIS — R69 Illness, unspecified: Secondary | ICD-10-CM | POA: Diagnosis not present

## 2018-05-06 DIAGNOSIS — J029 Acute pharyngitis, unspecified: Secondary | ICD-10-CM

## 2018-05-06 DIAGNOSIS — R509 Fever, unspecified: Secondary | ICD-10-CM | POA: Diagnosis not present

## 2018-05-06 DIAGNOSIS — J111 Influenza due to unidentified influenza virus with other respiratory manifestations: Secondary | ICD-10-CM

## 2018-05-06 DIAGNOSIS — R05 Cough: Secondary | ICD-10-CM

## 2018-05-06 LAB — RAPID INFLUENZA A&B ANTIGENS (ARMC ONLY): INFLUENZA B (ARMC): NEGATIVE

## 2018-05-06 LAB — RAPID STREP SCREEN (MED CTR MEBANE ONLY): STREPTOCOCCUS, GROUP A SCREEN (DIRECT): NEGATIVE

## 2018-05-06 LAB — RAPID INFLUENZA A&B ANTIGENS: Influenza A (ARMC): NEGATIVE

## 2018-05-06 MED ORDER — OSELTAMIVIR PHOSPHATE 75 MG PO CAPS: 75.0000 mg | ORAL_CAPSULE | Freq: Two times a day (BID) | ORAL | 0 refills | Status: DC

## 2018-05-06 NOTE — ED Provider Notes (Signed)
[facility]    CSN: [id number] Arrival date & time: 05/06/18  0930  History   Chief Complaint Fever  HPI  28 year old Hodge presents with fever, cough, chills, sore throat.  Patient reports her symptoms started yesterday.  She reports cough, chills, sore throat, fever.  Patient reports a fever of 100.4.  No medications or interventions tried.  Patient was told that she needed to come in for evaluation by her employer.  No reported sick contacts.  However, she does work at a bank.  No known exacerbating or relieving factors.  No other reported symptoms.  No other complaints.  History reviewed and updated as below.  Past Medical History:  Diagnosis Date  . Depression    Patient Active Problem List   Diagnosis Date Noted  . Family history of breast cancer 05/01/2018  . Tinea versicolor 03/24/2015  . Anxiety and depression 03/24/2015  . Surveillance of contraceptive pill 03/24/2015   OB History    Gravida  0   Para  0   Term  0   Preterm  0   AB  0   Living  0     SAB  0   TAB  0   Ectopic  0   Multiple  0   Live Births  0          Home Medications    Prior to Admission medications   Medication Sig Start Date End Date Taking? Authorizing Provider  busPIRone (BUSPAR) 5 MG tablet Take 1.5 tablets in daytime and 2 tablets at night. 11/28/17  Yes Galen Manila, NP  norethindrone-ethinyl estradiol-iron (BLISOVI FE 1.5/30) 1.5-30 MG-MCG tablet TAKE EVERY TABLET BY MOUTH EVERY DAY 05/01/18  Yes Copland, Alicia B, PA-C  traZODone (DESYREL) 50 MG tablet Take 1 tablet (50 mg total) by mouth at bedtime. 11/28/17  Yes Galen Manila, NP  oseltamivir (TAMIFLU) 75 MG capsule Take 1 capsule (75 mg total) by mouth every 12 (twelve) hours. 05/06/18   Tommie Sams, DO   Family History Family History  Problem Relation Age of Onset  . Alcohol abuse Father   . Depression Father   . Cancer Maternal Uncle        colon   . Breast cancer Paternal  Aunt 40  . Arthritis Maternal Grandmother   . Cancer Maternal Grandmother        lung cancer  . Diabetes Maternal Grandmother   . Arthritis Maternal Grandfather   . Diabetes Maternal Grandfather   . Alcohol abuse Paternal Grandmother    Social History Social History   Tobacco Use  . Smoking status: Never Smoker  . Smokeless tobacco: Never Used  Substance Use Topics  . Alcohol use: Yes    Alcohol/week: 0.0 standard drinks    Comment: occasionally  . Drug use: No    Allergies   Patient has no known allergies.   Review of Systems Review of Systems  Constitutional: Positive for chills and fever.  HENT: Positive for sore throat.   Respiratory: Positive for cough.    Physical Exam Triage Vital Signs ED Triage Vitals  Enc Vitals Group     BP 05/06/18 0954 119/Debra     Pulse Rate 05/06/18 0954 (!) 103     Resp 05/06/18 0954 16     Temp 05/06/18 0954 99.2 F (37.3 C)     Temp Source 05/06/18 0954 Oral     SpO2 05/06/18 0954 98 %     Weight 05/06/18 0958  124 lb (56.2 kg)     Height 05/06/18 0958 5\' 3"  (1.6 m)     Head Circumference --      Peak Flow --      Pain Score 05/06/18 0956 2     Pain Loc --      Pain Edu? --      Excl. in GC? --    Updated Vital Signs BP 119/Debra (BP Location: Right Arm)   Pulse (!) 103   Temp 99.2 F (37.3 C) (Oral)   Resp 16   Ht 5\' 3"  (1.6 m)   Wt 56.2 kg   LMP 04/22/2018   SpO2 98%   BMI 21.97 kg/m   Visual Acuity Right Eye Distance:   Left Eye Distance:   Bilateral Distance:    Right Eye Near:   Left Eye Near:    Bilateral Near:     Physical Exam Vitals signs and nursing note reviewed.  Constitutional:      General: She is not in acute distress.    Appearance: Normal appearance.  HENT:     Head: Normocephalic and atraumatic.     Right Ear: Tympanic membrane normal.     Left Ear: Tympanic membrane normal.     Mouth/Throat:     Pharynx: Oropharynx is clear. No oropharyngeal exudate.  Eyes:     General:        Right  eye: No discharge.        Left eye: No discharge.     Conjunctiva/sclera: Conjunctivae normal.  Cardiovascular:     Rate and Rhythm: Regular rhythm. Tachycardia present.  Pulmonary:     Effort: Pulmonary effort is normal.     Breath sounds: Normal breath sounds.  Neurological:     Mental Status: She is alert.  Psychiatric:        Mood and Affect: Mood normal.        Behavior: Behavior normal.    UC Treatments / Results  Labs (all labs ordered are listed, but only abnormal results are displayed) Labs Reviewed  RAPID INFLUENZA A&B ANTIGENS ([facility] ONLY)  RAPID STREP SCREEN ([facility] ONLY)  CULTURE, GROUP A STREP [facility])    EKG None  Radiology No results found.  Procedures Procedures (including critical care time)  Medications Ordered in UC Medications - No data to display  Initial Impression / Assessment and Plan / UC Course  I have reviewed the triage vital signs and the nursing notes.  Pertinent labs & imaging results that were available during my care of the patient were reviewed by me and considered in my medical decision making (see chart for details).    28 year old Hodge presents with influenza-like illness.  Testing negative today.  I am still concerned about influenza given the status in our community as well as the limited nature of the test.  Treating with Tamiflu.  Work note given.  Final Clinical Impressions(s) / UC Diagnoses   Final diagnoses:  Influenza-like illness     Discharge Instructions     Rest. Fluids.  Medication as prescribed.  Take care  Dr. Adriana Simas    ED Prescriptions    Medication Sig Dispense Auth. Provider   oseltamivir (TAMIFLU) 75 MG capsule Take 1 capsule (75 mg total) by mouth every 12 (twelve) hours. 10 capsule Tommie Sams, DO     Controlled Substance Prescriptions [place] Controlled Substance Registry consulted? Not Applicable   Tommie Sams, DO 05/06/18 1058

## 2018-05-06 NOTE — ED Triage Notes (Signed)
Patient c/o fever at home of 100.4. pt stated with cough and sore throat.

## 2018-05-06 NOTE — Discharge Instructions (Signed)
Rest. Fluids.  Medication as prescribed.   Take care  Dr. Asuka Dusseau  

## 2018-05-09 LAB — CULTURE, GROUP A STREP (THRC)

## 2018-05-10 ENCOUNTER — Telehealth: Payer: Self-pay

## 2018-05-10 NOTE — Telephone Encounter (Signed)
Pick up Tamiflu and take it if not improving.  It may help shorten course of illness if it is flu.   Continue monitoring symptoms.  I have sent MyChart message for self-quarantine information.    She will stay out of work until symptoms resolve.  A letter can be provided for return to work on 05/15/2018.  If patient remains symptomatic, she should call clinic for another extension.

## 2018-05-10 NOTE — Telephone Encounter (Signed)
The pt called today requesting a extension on a work note she received from Urgent Care x 4 days ago. The pt was seen and diagnose w/ influenza and treated although the test was negative, because of her flu like symptoms. She calling stating she still not feeling well and her job is requesting that she gets  tested for COVID19 because [place]. I instructed the patient that she doesn't meet the criteria and that her symptoms sounds more like the flu. I asked the patient did she start the Tamiflu and she informed me that she didn't pick up the prescription because the flu test was negative.  She states that today her temp is 102, chills, body aches, mild coughing and fatigue. She denies any SOB, exposure to COVID19, or travel in the last 2 weeks  Please advise

## 2018-07-03 ENCOUNTER — Other Ambulatory Visit: Payer: Self-pay | Admitting: Nurse Practitioner

## 2018-07-03 ENCOUNTER — Encounter: Payer: Self-pay | Admitting: Nurse Practitioner

## 2018-07-03 DIAGNOSIS — F329 Major depressive disorder, single episode, unspecified: Secondary | ICD-10-CM

## 2018-07-03 DIAGNOSIS — F32A Depression, unspecified: Secondary | ICD-10-CM

## 2018-07-03 DIAGNOSIS — F419 Anxiety disorder, unspecified: Secondary | ICD-10-CM

## 2018-07-04 ENCOUNTER — Telehealth: Payer: Self-pay | Admitting: Nurse Practitioner

## 2018-07-04 NOTE — Telephone Encounter (Signed)
Duplicate message. See mychart message today.  Rx Trazodone already sent. Apt on 5/14  Saralyn Pilar, DO [facility] 07/04/2018, 4:51 PM

## 2018-07-04 NOTE — Telephone Encounter (Signed)
Pt wanted to know if you would call in trazodone she have appt  5/14

## 2018-07-06 ENCOUNTER — Ambulatory Visit (INDEPENDENT_AMBULATORY_CARE_PROVIDER_SITE_OTHER): Payer: BLUE CROSS/BLUE SHIELD | Admitting: Family Medicine

## 2018-07-06 ENCOUNTER — Encounter: Payer: Self-pay | Admitting: Family Medicine

## 2018-07-06 ENCOUNTER — Other Ambulatory Visit: Payer: Self-pay

## 2018-07-06 DIAGNOSIS — F419 Anxiety disorder, unspecified: Secondary | ICD-10-CM | POA: Diagnosis not present

## 2018-07-06 DIAGNOSIS — F3341 Major depressive disorder, recurrent, in partial remission: Secondary | ICD-10-CM | POA: Diagnosis not present

## 2018-07-06 DIAGNOSIS — F32A Depression, unspecified: Secondary | ICD-10-CM | POA: Insufficient documentation

## 2018-07-06 MED ORDER — BUSPIRONE HCL 5 MG PO TABS: ORAL_TABLET | ORAL | 0 refills | Status: DC

## 2018-07-06 NOTE — Progress Notes (Signed)
Virtual Visit via Telephone The purpose of this virtual visit is to provide medical care while limiting exposure to the novel coronavirus (COVID19) for both patient and office staff.  Consent was obtained for phone visit:  Yes.   Answered questions that patient had about telehealth interaction:  Yes.   I discussed the limitations, risks, security and privacy concerns of performing an evaluation and management service by telephone. I also discussed with the patient that there may be a patient responsible charge related to this service. The patient expressed understanding and agreed to proceed.  Patient Location: Home Provider Location: [facility] (Office)  PCP is Wilhelmina McardleLauren Kennedy, AGPCNP-BC - I am currently covering during her maternity leave.   ---------------------------------------------------------------------- Chief Complaint  Patient presents with  . Depression    S: Reviewed CMA documentation. I have called patient and gathered additional HPI as follows:  Major Depression, recurrent in partial remission / Anxiety - Last visit with PCP 11/2017, for same problem, treated with buspar and trazodone, see prior notes for background information. - Interval update with doing well overall, she was overdue for apt and refill, contacted us for refill on trazodone - Today patient reports no new concerns at this time. Acknowledges stressors with coronavirus pandemic. History of work stressors documented from prior conversation with PCP - Taking Buspirone 5mg  1.5 tab in morning and 2 tab in PM for anxiety with good results - Taking Trazodone 50mg  nightly, for mood and insomnia. Working well to help her stay asleep. Occasionally wakes up overnight but able to fall back asleep. Denies worsening depression, panic attack, worse insomnia, agitation   Past Medical History:  Diagnosis Date  . Depression    Social History   Tobacco Use  . Smoking status: Never Smoker  . Smokeless  tobacco: Never Used  Substance Use Topics  . Alcohol use: Yes    Alcohol/week: 0.0 standard drinks    Comment: occasionally  . Drug use: No    Current Outpatient Medications:  .  busPIRone (BUSPAR) 5 MG tablet, Take 1.5 tablets in daytime and 2 tablets at night., Disp: 315 tablet, Rfl: 0 .  norethindrone-ethinyl estradiol-iron (BLISOVI FE 1.5/30) 1.5-30 MG-MCG tablet, TAKE EVERY TABLET BY MOUTH EVERY DAY, Disp: 84 tablet, Rfl: 3 .  traZODone (DESYREL) 50 MG tablet, Take 1 tablet (50 mg total) by mouth at bedtime., Disp: 30 tablet, Rfl: 2  [facility] 07/06/2018 11/28/2017 07/11/2017  Decreased Interest 1 1 1   Down, Depressed, Hopeless 2 1 1   PHQ - 2 Score 3 2 2   Altered sleeping 1 1 1   Tired, decreased energy 2 1 2   Change in appetite 1 1 1   Feeling bad or failure about yourself  1 1 1   Trouble concentrating 0 0 1  Moving slowly or fidgety/restless 1 0 0  Suicidal thoughts 0 0 0  PHQ-9 Score 9 6 8   Difficult doing work/chores Not difficult at all Somewhat difficult Somewhat difficult  Some recent data might be hidden    GAD 7 : Generalized Anxiety Score 07/06/2018 11/28/2017 07/11/2017 02/28/2017  Nervous, Anxious, on Edge 3 2 2 3   Control/stop worrying 1 1 1 1   Worry too much - different things 1 1 1 1   Trouble relaxing 1 1 1 1   Restless 0 0 0 0  Easily annoyed or irritable 1 0 1 1  Afraid - awful might happen 0 0 0 0  Total GAD 7 Score 7 5 6 7   Anxiety Difficulty Not difficult  at all Somewhat difficult Somewhat difficult Not difficult at all    -------------------------------------------------------------------------- O: No physical exam performed due to remote telephone encounter.   -------------------------------------------------------------------------- A&P:  Problem List Items Addressed This Visit    Anxiety   Relevant Medications   busPIRone (BUSPAR) 5 MG tablet   Recurrent major depressive disorder, in partial remission (HCC)   Relevant Medications    busPIRone (BUSPAR) 5 MG tablet     Clinically stable chronic recurrent depression in partial remission now, controlled on meds Associated anxiety and insomnia  Plan - Re ordered Trazodone recently prior to this apt to maintain treatment to avoid patient running out of medication - Continue Trazodone 50mg  nightly, has 30 day with refills for up to 3 months - Re order Buspar same dosing 1.5 AM / 2 PM - ordered 90 day supply 0 refill sent to pharmacy now   Meds ordered this encounter  Medications  . busPIRone (BUSPAR) 5 MG tablet    Sig: Take 1.5 tablets in daytime and 2 tablets at night.    Dispense:  315 tablet    Refill:  0    Follow-up: - Return in 3 months for Mood/Anxiety med PHQ GAD w/ PCP  Patient verbalizes understanding with the above medical recommendations including the limitation of remote medical advice.  Specific follow-up and call-back criteria were given for patient to follow-up or seek medical care more urgently if needed.  - Time spent in direct consultation with patient on phone: 8 minutes   Saralyn Pilar, DO [facility] 07/06/2018, 11:42 AM

## 2018-07-06 NOTE — Patient Instructions (Addendum)
Re ordered Buspar today 90 day supply  If need trazodone converted back to 90 day supply let us know  Please schedule a Follow-up Appointment to: Return in about 3 months (around 10/06/2018) for Depression/Anxiety meds, w/ PCP.  If you have any other questions or concerns, please feel free to call the office or send a message through MyChart. You may also schedule an earlier appointment if necessary.  Additionally, you may be receiving a survey about your experience at our office within a few days to 1 week by e-mail or mail. We value your feedback.  Saralyn Pilar, DO [facility], [place]

## 2018-12-28 ENCOUNTER — Other Ambulatory Visit: Payer: Self-pay | Admitting: Family Medicine

## 2018-12-28 DIAGNOSIS — F329 Major depressive disorder, single episode, unspecified: Secondary | ICD-10-CM

## 2018-12-28 DIAGNOSIS — F32A Depression, unspecified: Secondary | ICD-10-CM

## 2019-01-02 ENCOUNTER — Other Ambulatory Visit: Payer: Self-pay | Admitting: Family Medicine

## 2019-01-02 DIAGNOSIS — F419 Anxiety disorder, unspecified: Secondary | ICD-10-CM

## 2019-04-06 DIAGNOSIS — Z20828 Contact with and (suspected) exposure to other viral communicable diseases: Secondary | ICD-10-CM | POA: Diagnosis not present

## 2019-05-18 ENCOUNTER — Other Ambulatory Visit: Payer: Self-pay | Admitting: Obstetrics and Gynecology

## 2019-05-18 DIAGNOSIS — Z3041 Encounter for surveillance of contraceptive pills: Secondary | ICD-10-CM

## 2019-06-11 ENCOUNTER — Other Ambulatory Visit: Payer: Self-pay | Admitting: Obstetrics and Gynecology

## 2019-06-11 DIAGNOSIS — Z3041 Encounter for surveillance of contraceptive pills: Secondary | ICD-10-CM

## 2019-06-14 ENCOUNTER — Other Ambulatory Visit: Payer: Self-pay

## 2019-06-14 DIAGNOSIS — Z3041 Encounter for surveillance of contraceptive pills: Secondary | ICD-10-CM

## 2019-06-14 MED ORDER — NORETHIN ACE-ETH ESTRAD-FE 1.5-30 MG-MCG PO TABS: 1.0000 | ORAL_TABLET | Freq: Every day | ORAL | 0 refills | Status: DC

## 2019-06-14 NOTE — Telephone Encounter (Signed)
Pt needed one more refill to get to her appt next week. She knows to make sure she keeps this appt or no more OCP's will be refilled.

## 2019-06-19 ENCOUNTER — Ambulatory Visit (INDEPENDENT_AMBULATORY_CARE_PROVIDER_SITE_OTHER): Payer: BC Managed Care – PPO | Admitting: Obstetrics and Gynecology

## 2019-06-19 ENCOUNTER — Other Ambulatory Visit: Payer: Self-pay

## 2019-06-19 ENCOUNTER — Encounter: Payer: Self-pay | Admitting: Obstetrics and Gynecology

## 2019-06-19 VITALS — BP 90/70 | Ht 63.0 in | Wt 127.0 lb

## 2019-06-19 DIAGNOSIS — Z803 Family history of malignant neoplasm of breast: Secondary | ICD-10-CM | POA: Diagnosis not present

## 2019-06-19 DIAGNOSIS — Z3041 Encounter for surveillance of contraceptive pills: Secondary | ICD-10-CM | POA: Diagnosis not present

## 2019-06-19 DIAGNOSIS — Z01419 Encounter for gynecological examination (general) (routine) without abnormal findings: Secondary | ICD-10-CM

## 2019-06-19 MED ORDER — NORETHIN ACE-ETH ESTRAD-FE 1.5-30 MG-MCG PO TABS: 1.0000 | ORAL_TABLET | Freq: Every day | ORAL | 0 refills | Status: DC

## 2019-06-19 NOTE — Patient Instructions (Signed)
I value your feedback and entrusting us with your care. If you get a [facility] patient survey, I would appreciate you taking the time to let us know about your experience today. Thank you!  As of February 01, 2019, your lab results will be released to your MyChart immediately, before I even have a chance to see them. Please give me time to review them and contact you if there are any abnormalities. Thank you for your patience.  

## 2019-06-19 NOTE — Progress Notes (Signed)
PCP:  Galen Manila, NP (Inactive)   Chief Complaint  Patient presents with  . Gynecologic Exam     HPI:      Debra Hodge is a 29 y.o. No obstetric history on file. who LMP was Patient's last menstrual period was 06/11/2019 (approximate)., presents today for her annual examination.  Her menses are regular every 28-30 days, lasting 3-4 days.  Dysmenorrhea mild. She does not have intermenstrual bleeding.  Sex activity: single partner, contraception - OCP (estrogen/progesterone).  Last Pap: 04/18/17  Results were: no abnormalities Hx of STDs: none  There is a FH of breast cancer in her pat aunt, genetic testing not done and declined last yr. There is no FH of ovarian cancer. The patient does not do monthly self-breast exams.  Tobacco use: The patient denies current or previous tobacco use. Alcohol use: none No drug use.  Exercise: moderately active  She does get adequate calcium but not Vitamin D in her diet.   Past Medical History:  Diagnosis Date  . Depression     History reviewed. No pertinent surgical history.  Family History  Problem Relation Age of Onset  . Alcohol abuse Father   . Depression Father   . Cancer Maternal Uncle        colon   . Breast cancer Paternal Aunt 40  . Arthritis Maternal Grandmother   . Cancer Maternal Grandmother        lung cancer  . Diabetes Maternal Grandmother   . Arthritis Maternal Grandfather   . Diabetes Maternal Grandfather   . Alcohol abuse Paternal Grandmother     Social History   Socioeconomic History  . Marital status: Single    Spouse name: Not on file  . Number of children: Not on file  . Years of education: Not on file  . Highest education level: Not on file  Occupational History  . Not on file  Tobacco Use  . Smoking status: Never Smoker  . Smokeless tobacco: Never Used  Substance and Sexual Activity  . Alcohol use: Yes    Alcohol/week: 0.0 standard drinks    Comment: occasionally  . Drug  use: No  . Sexual activity: Yes    Birth control/protection: Pill  Other Topics Concern  . Not on file  Social History Narrative  . Not on file   Social Determinants of Health   Financial Resource Strain:   . Difficulty of Paying Living Expenses:   Food Insecurity:   . Worried About Programme researcher, broadcasting/film/video in the Last Year:   . Barista in the Last Year:   Transportation Needs:   . Freight forwarder (Medical):   Marland Kitchen Lack of Transportation (Non-Medical):   Physical Activity:   . Days of Exercise per Week:   . Minutes of Exercise per Session:   Stress:   . Feeling of Stress :   Social Connections:   . Frequency of Communication with Friends and Family:   . Frequency of Social Gatherings with Friends and Family:   . Attends Religious Services:   . Active Member of Clubs or Organizations:   . Attends Banker Meetings:   Marland Kitchen Marital Status:   Intimate Partner Violence:   . Fear of Current or Ex-Partner:   . Emotionally Abused:   Marland Kitchen Physically Abused:   . Sexually Abused:     Current Meds  Medication Sig  . norethindrone-ethinyl estradiol-iron (AUROVELA FE 1.5/30) 1.5-30 MG-MCG tablet Take 1 tablet  by mouth daily.  . traZODone (DESYREL) 50 MG tablet TAKE 1 TABLET(50 MG) BY MOUTH AT BEDTIME  . [DISCONTINUED] norethindrone-ethinyl estradiol-iron (AUROVELA FE 1.5/30) 1.5-30 MG-MCG tablet Take 1 tablet by mouth daily.     ROS:  Review of Systems  Constitutional: Negative for fatigue, fever and unexpected weight change.  Respiratory: Negative for cough, shortness of breath and wheezing.   Cardiovascular: Negative for chest pain, palpitations and leg swelling.  Gastrointestinal: Negative for blood in stool, constipation, diarrhea, nausea and vomiting.  Endocrine: Negative for cold intolerance, heat intolerance and polyuria.  Genitourinary: Negative for dyspareunia, dysuria, flank pain, frequency, genital sores, hematuria, menstrual problem, pelvic pain,  urgency, vaginal bleeding, vaginal discharge and vaginal pain.  Musculoskeletal: Negative for arthralgias, back pain, joint swelling and myalgias.  Skin: Negative for rash.  Neurological: Negative for dizziness, syncope, light-headedness, numbness and headaches.  Hematological: Negative for adenopathy.  Psychiatric/Behavioral: Negative for agitation, confusion, sleep disturbance and suicidal ideas. The patient is not nervous/anxious.      Objective: BP 90/70   Ht 5\' 3"  (1.6 m)   Wt 127 lb (57.6 kg)   LMP 06/11/2019 (Approximate)   BMI 22.50 kg/m    Physical Exam Constitutional:      Appearance: She is well-developed.  Genitourinary:     Vulva, vagina, uterus, right adnexa and left adnexa normal.     No vulval lesion or tenderness noted.     No vaginal discharge, erythema or tenderness.     No cervical motion tenderness or polyp.     Uterus is not enlarged or tender.     No right or left adnexal mass present.     Right adnexa not tender.     Left adnexa not tender.  Neck:     Thyroid: No thyromegaly.  Cardiovascular:     Rate and Rhythm: Normal rate and regular rhythm.     Heart sounds: Normal heart sounds. No murmur.  Pulmonary:     Effort: Pulmonary effort is normal.     Breath sounds: Normal breath sounds.  Chest:     Breasts:        Right: No mass, nipple discharge, skin change or tenderness.        Left: No mass, nipple discharge, skin change or tenderness.  Abdominal:     Palpations: Abdomen is soft.     Tenderness: There is no abdominal tenderness. There is no guarding.  Musculoskeletal:        General: Normal range of motion.     Cervical back: Normal range of motion.  Neurological:     General: No focal deficit present.     Mental Status: She is alert and oriented to person, place, and time.     Cranial Nerves: No cranial nerve deficit.  Skin:    General: Skin is warm and dry.  Psychiatric:        Mood and Affect: Mood normal.        Behavior: Behavior  normal.        Thought Content: Thought content normal.        Judgment: Judgment normal.  Vitals reviewed.     Assessment/Plan: Encounter for annual routine gynecological examination  Encounter for surveillance of contraceptive pills - OCP RF - Plan: norethindrone-ethinyl estradiol-iron (AUROVELA FE 1.5/30) 1.5-30 MG-MCG tablet  Family history of breast cancer--MyRisk testing discussed and pt to f/u if desires.   Meds ordered this encounter  Medications  . norethindrone-ethinyl estradiol-iron (AUROVELA FE 1.5/30) 1.5-30 MG-MCG tablet  Sig: Take 1 tablet by mouth daily.    Dispense:  28 tablet    Refill:  0    Order Specific Question:   Supervising Provider    Answer:   Gae Dry [[id number]]             GYN counsel adequate intake of calcium and vitamin D, diet and exercise     F/U  Return in about 1 year (around 06/18/2020).  Alicia B. Copland, PA-C 06/19/2019 4:25 PM

## 2019-08-06 ENCOUNTER — Other Ambulatory Visit: Payer: Self-pay | Admitting: Obstetrics and Gynecology

## 2019-08-06 DIAGNOSIS — Z3041 Encounter for surveillance of contraceptive pills: Secondary | ICD-10-CM

## 2019-08-06 NOTE — Telephone Encounter (Signed)
Pt calling; at last visit 06/19/19 she thought bcp would be sent in for a year.  Instead it was just for one month.  Pt would like to know why. [place] [facility]. [phone]

## 2019-08-07 ENCOUNTER — Other Ambulatory Visit: Payer: Self-pay | Admitting: Obstetrics and Gynecology

## 2019-08-07 DIAGNOSIS — Z3041 Encounter for surveillance of contraceptive pills: Secondary | ICD-10-CM

## 2019-08-07 MED ORDER — NORETHIN ACE-ETH ESTRAD-FE 1.5-30 MG-MCG PO TABS: 1.0000 | ORAL_TABLET | Freq: Every day | ORAL | 3 refills | Status: DC

## 2019-08-07 NOTE — Telephone Encounter (Signed)
Pt aware.

## 2019-08-07 NOTE — Telephone Encounter (Signed)
1 yr RF eRxd today. Not sure why only 1 mo at 4/21 appt. My apologies! Thx.

## 2019-08-07 NOTE — Progress Notes (Signed)
Rx RF OCPs for a yr (should have been sent in 4/21).

## 2019-08-20 DIAGNOSIS — R0981 Nasal congestion: Secondary | ICD-10-CM | POA: Diagnosis not present

## 2019-08-20 DIAGNOSIS — R05 Cough: Secondary | ICD-10-CM | POA: Diagnosis not present

## 2019-08-20 DIAGNOSIS — Z20822 Contact with and (suspected) exposure to covid-19: Secondary | ICD-10-CM | POA: Diagnosis not present

## 2020-06-22 DIAGNOSIS — Z1371 Encounter for nonprocreative screening for genetic disease carrier status: Secondary | ICD-10-CM

## 2020-06-22 DIAGNOSIS — Z9189 Other specified personal risk factors, not elsewhere classified: Secondary | ICD-10-CM

## 2020-06-22 HISTORY — DX: Encounter for nonprocreative screening for genetic disease carrier status: Z13.71

## 2020-06-22 HISTORY — DX: Other specified personal risk factors, not elsewhere classified: Z91.89

## 2020-07-01 NOTE — Patient Instructions (Signed)
I value your feedback and you entrusting us with your care. If you get a [facility] patient survey, I would appreciate you taking the time to let us know about your experience today. Thank you! ? ? ?

## 2020-07-01 NOTE — Progress Notes (Addendum)
PCP:  Mikey College, NP (Inactive)   Chief Complaint  Patient presents with  . Gynecologic Exam    Annual - desires genetic testing. RM 13     HPI:      [name] is a 30 y.o. No obstetric history on file. who LMP was Patient's last menstrual period was 07/01/2020., presents today for her annual examination.  Her menses are regular every 28-30 days, lasting 3-4 days on OCPs.  Dysmenorrhea mild. She does not have intermenstrual bleeding. Stopped OCPs last month, started menses yesterday.   Sex activity: single partner, contraception - none, conception ok. Hasn't started PNVs.  Last Pap: 04/18/17  Results were: no abnormalities Hx of STDs: none  There is a FH of breast cancer in her pat aunt, genetic testing not done and declined in past. There is no FH of ovarian cancer. Her mom was just diagnosed with stage 4 uterine cancer (mom not doing testing), and her mat uncle with colon cancer. Pt would like genetic testing. The patient does not do monthly self-breast exams.  Tobacco use: The patient denies current or previous tobacco use. Alcohol use: none No drug use.  Exercise: min active  She does get adequate calcium but not Vitamin D in her diet.   Past Medical History:  Diagnosis Date  . Depression     History reviewed. No pertinent surgical history.  Family History  Problem Relation Age of Onset  . Alcohol abuse Father   . Depression Father   . Colon cancer Maternal Uncle 38  . Breast cancer Paternal Aunt 68  . Arthritis Maternal Grandmother   . Cancer Maternal Grandmother        lung cancer  . Diabetes Maternal Grandmother   . Arthritis Maternal Grandfather   . Diabetes Maternal Grandfather   . Alcohol abuse Paternal Grandmother   . Endometrial cancer Mother 56    Social History   Socioeconomic History  . Marital status: Married    Spouse name: Not on file  . Number of children: Not on file  . Years of education: Not on file  . Highest  education level: Not on file  Occupational History  . Not on file  Tobacco Use  . Smoking status: Never Smoker  . Smokeless tobacco: Never Used  Vaping Use  . Vaping Use: Never used  Substance and Sexual Activity  . Alcohol use: Yes    Alcohol/week: 0.0 standard drinks    Comment: occasionally  . Drug use: No  . Sexual activity: Yes    Birth control/protection: Pill, None  Other Topics Concern  . Not on file  Social History Narrative  . Not on file   Social Determinants of Health   Financial Resource Strain: Not on file  Food Insecurity: Not on file  Transportation Needs: Not on file  Physical Activity: Not on file  Stress: Not on file  Social Connections: Not on file  Intimate Partner Violence: Not on file    No outpatient medications have been marked as taking for the 07/02/20 encounter (Office Visit) with Anala Whisenant B, PA-C.     ROS:  Review of Systems  Constitutional: Negative for fatigue, fever and unexpected weight change.  Respiratory: Negative for cough, shortness of breath and wheezing.   Cardiovascular: Negative for chest pain, palpitations and leg swelling.  Gastrointestinal: Negative for blood in stool, constipation, diarrhea, nausea and vomiting.  Endocrine: Negative for cold intolerance, heat intolerance and polyuria.  Genitourinary: Negative for dyspareunia, dysuria,  flank pain, frequency, genital sores, hematuria, menstrual problem, pelvic pain, urgency, vaginal bleeding, vaginal discharge and vaginal pain.  Musculoskeletal: Negative for arthralgias, back pain, joint swelling and myalgias.  Skin: Negative for rash.  Neurological: Negative for dizziness, syncope, light-headedness, numbness and headaches.  Hematological: Negative for adenopathy.  Psychiatric/Behavioral: Negative for agitation, confusion, sleep disturbance and suicidal ideas. The patient is not nervous/anxious.      Objective: BP 118/68   Pulse (!) 107   Ht '5\' 3"'  (1.6 m)   Wt  126 lb (57.2 kg)   LMP 07/01/2020   BMI 22.32 kg/m    Physical Exam Constitutional:      Appearance: She is well-developed.  Genitourinary:     Vulva normal.     Right Labia: No rash, tenderness or lesions.    Left Labia: No tenderness, lesions or rash.    No vaginal discharge, erythema or tenderness.      Right Adnexa: not tender and no mass present.    Left Adnexa: not tender and no mass present.    No cervical motion tenderness, friability or polyp.     Uterus is not enlarged or tender.  Breasts:     Right: No mass, nipple discharge, skin change or tenderness.     Left: No mass, nipple discharge, skin change or tenderness.    Neck:     Thyroid: No thyromegaly.  Cardiovascular:     Rate and Rhythm: Normal rate and regular rhythm.     Heart sounds: Normal heart sounds. No murmur heard.   Pulmonary:     Effort: Pulmonary effort is normal.     Breath sounds: Normal breath sounds.  Abdominal:     Palpations: Abdomen is soft.     Tenderness: There is no abdominal tenderness. There is no guarding or rebound.  Musculoskeletal:        General: Normal range of motion.     Cervical back: Normal range of motion.  Lymphadenopathy:     Cervical: No cervical adenopathy.  Neurological:     General: No focal deficit present.     Mental Status: She is alert and oriented to person, place, and time.     Cranial Nerves: No cranial nerve deficit.  Skin:    General: Skin is warm and dry.  Psychiatric:        Mood and Affect: Mood normal.        Behavior: Behavior normal.        Thought Content: Thought content normal.        Judgment: Judgment normal.  Vitals reviewed.     Assessment/Plan: Encounter for annual routine gynecological examination  Cervical cancer screening - Plan: Cytology - PAP  Screening for HPV (human papillomavirus) - Plan: Cytology - PAP  Family history of breast cancer - Plan: [facility] ([place] MyRisk testing  discussed and done today. Pros/cons/future screening and medical mgmt options based on results. Will also have TC and riskscore to manage breast cancer screenings. Will call with results  Family history of uterine cancer - Plan: [facility] ([place]); stage 4 in her mom, declines testing; hospice care now.  Pre-conception counseling--start PNVs, f/u prn NOB     GYN counsel adequate intake of calcium and vitamin D, diet and exercise     F/U  Return in about 1 year (around 07/02/2021).  Vielka Klinedinst B. Kahil Agner, PA-C 07/02/2020 10:56 AM

## 2020-07-02 ENCOUNTER — Encounter: Payer: Self-pay | Admitting: Obstetrics and Gynecology

## 2020-07-02 ENCOUNTER — Other Ambulatory Visit: Payer: Self-pay

## 2020-07-02 ENCOUNTER — Ambulatory Visit (INDEPENDENT_AMBULATORY_CARE_PROVIDER_SITE_OTHER): Payer: BC Managed Care – PPO | Admitting: Obstetrics and Gynecology

## 2020-07-02 ENCOUNTER — Other Ambulatory Visit (HOSPITAL_COMMUNITY)
Admission: RE | Admit: 2020-07-02 | Discharge: 2020-07-02 | Disposition: A | Discharge status: Home / Self Care | Payer: BC Managed Care – PPO | Source: Ambulatory Visit | Attending: Obstetrics and Gynecology | Admitting: Obstetrics and Gynecology

## 2020-07-02 VITALS — BP 118/68 | HR 107 | Ht 63.0 in | Wt 126.0 lb

## 2020-07-02 DIAGNOSIS — Z803 Family history of malignant neoplasm of breast: Secondary | ICD-10-CM

## 2020-07-02 DIAGNOSIS — Z800 Family history of malignant neoplasm of digestive organs: Secondary | ICD-10-CM | POA: Diagnosis not present

## 2020-07-02 DIAGNOSIS — Z8049 Family history of malignant neoplasm of other genital organs: Secondary | ICD-10-CM | POA: Diagnosis not present

## 2020-07-02 DIAGNOSIS — Z1151 Encounter for screening for human papillomavirus (HPV): Secondary | ICD-10-CM | POA: Insufficient documentation

## 2020-07-02 DIAGNOSIS — Z01419 Encounter for gynecological examination (general) (routine) without abnormal findings: Secondary | ICD-10-CM

## 2020-07-02 DIAGNOSIS — Z3169 Encounter for other general counseling and advice on procreation: Secondary | ICD-10-CM

## 2020-07-02 DIAGNOSIS — Z124 Encounter for screening for malignant neoplasm of cervix: Secondary | ICD-10-CM | POA: Diagnosis not present

## 2020-07-02 DIAGNOSIS — Z3041 Encounter for surveillance of contraceptive pills: Secondary | ICD-10-CM

## 2020-07-02 DIAGNOSIS — Z801 Family history of malignant neoplasm of trachea, bronchus and lung: Secondary | ICD-10-CM | POA: Diagnosis not present

## 2020-07-03 ENCOUNTER — Telehealth (INDEPENDENT_AMBULATORY_CARE_PROVIDER_SITE_OTHER): Payer: BC Managed Care – PPO | Admitting: Internal Medicine

## 2020-07-03 ENCOUNTER — Encounter: Payer: Self-pay | Admitting: Internal Medicine

## 2020-07-03 VITALS — Ht 63.0 in | Wt 126.0 lb

## 2020-07-03 DIAGNOSIS — F419 Anxiety disorder, unspecified: Secondary | ICD-10-CM | POA: Diagnosis not present

## 2020-07-03 MED ORDER — DIAZEPAM 5 MG PO TABS: ORAL_TABLET | ORAL | 0 refills | Status: DC

## 2020-07-03 NOTE — Progress Notes (Signed)
Virtual Visit via Video Note  I connected with Debra Hodge on 07/03/20 at  2:20 PM EDT by a video enabled telemedicine application and verified that I am speaking with the correct person using two identifiers.  Location: Patient: Home Provider: Office  Person's participating in this video call: Nicki Reaper, NP and Debra Hodge.   I discussed the limitations of evaluation and management by telemedicine and the availability of in person appointments. The patient expressed understanding and agreed to proceed.  History of Present Illness:  Pt requesting premed for tooth extraction. She has a history of anxiety and depression, not currently medicated.  She is having 2 wisdom teeth removed tomorrow and again in 2 weeks without sedation.     Past Medical History:  Diagnosis Date  . Depression     No current outpatient medications on file.   No current facility-administered medications for this visit.    Allergies  Allergen Reactions  . Shrimp [Shellfish Allergy] Other (See Comments)    Headache and vomiting    Family History  Problem Relation Age of Onset  . Alcohol abuse Father   . Depression Father   . Colon cancer Maternal Uncle 50  . Breast cancer Paternal Aunt 40  . Arthritis Maternal Grandmother   . Cancer Maternal Grandmother        lung cancer  . Diabetes Maternal Grandmother   . Arthritis Maternal Grandfather   . Diabetes Maternal Grandfather   . Alcohol abuse Paternal Grandmother   . Endometrial cancer Mother 39    Social History   Socioeconomic History  . Marital status: Married    Spouse name: Not on file  . Number of children: Not on file  . Years of education: Not on file  . Highest education level: Not on file  Occupational History  . Not on file  Tobacco Use  . Smoking status: Never Smoker  . Smokeless tobacco: Never Used  Vaping Use  . Vaping Use: Never used  Substance and Sexual Activity  . Alcohol use: Yes    Alcohol/week: 0.0  standard drinks    Comment: occasionally  . Drug use: No  . Sexual activity: Yes    Birth control/protection: Pill, None  Other Topics Concern  . Not on file  Social History Narrative  . Not on file   Social Determinants of Health   Financial Resource Strain: Not on file  Food Insecurity: Not on file  Transportation Needs: Not on file  Physical Activity: Not on file  Stress: Not on file  Social Connections: Not on file  Intimate Partner Violence: Not on file     Constitutional: Denies fever, malaise, fatigue, headache or abrupt weight changes.  HEENT: Denies eye pain, eye redness, ear pain, ringing in the ears, wax buildup, runny nose, nasal congestion, bloody nose, or sore throat. Respiratory: Denies difficulty breathing, shortness of breath, cough or sputum production.   Cardiovascular: Denies chest pain, chest tightness, palpitations or swelling in the hands or feet.  Psych: Pt reports anxiety. Denies depression, SI/HI.  No other specific complaints in a complete review of systems (except as listed in HPI above).  Observations/Objective:  LMP 07/01/2020  Wt Readings from Last 3 Encounters:  07/02/20 126 lb (57.2 kg)  06/19/19 127 lb (57.6 kg)  05/06/18 124 lb (56.2 kg)    General: Appears her stated age, well developed, well nourished in NAD. HEENT: Head: normal shape and size;  Pulmonary/Chest: Normal effort. No respiratory distress.  Neurological: Alert and oriented.  Psychiatric: Mood and affect normal.  Mildly anxious appearing. Judgment and thought content normal.     Assessment and Plan:  Procedural Anxiety:  Rx for Valium 5 mg 1 tab 1 hour prior to dental procedures She understands she needs to have someone take her to and from this procedure  Return precautions discussed  Follow Up Instructions:    I discussed the assessment and treatment plan with the patient. The patient was provided an opportunity to ask questions and all were answered. The  patient agreed with the plan and demonstrated an understanding of the instructions.   The patient was advised to call back or seek an in-person evaluation if the symptoms worsen or if the condition fails to improve as anticipated.    Nicki Reaper, NP

## 2020-07-03 NOTE — Patient Instructions (Signed)
[email or web address].Gov">  Generalized Anxiety Disorder, Adult Generalized anxiety disorder (GAD) is a mental health condition. Unlike normal worries, anxiety related to GAD is not triggered by a specific event. These worries do not fade or get better with time. GAD interferes with relationships, work, and school. GAD symptoms can vary from mild to severe. People with severe GAD can have intense waves of anxiety with physical symptoms that are similar to panic attacks. What are the causes? The exact cause of GAD is not known, but the following are believed to have an impact:  Differences in natural brain chemicals.  Genes passed down from parents to children.  Differences in the way threats are perceived.  Development during childhood.  Personality. What increases the risk? The following factors may make you more likely to develop this condition:  Being female.  Having a family history of anxiety disorders.  Being very shy.  Experiencing very stressful life events, such as the death of a loved one.  Having a very stressful family environment. What are the signs or symptoms? People with GAD often worry excessively about many things in their lives, such as their health and family. Symptoms may also include:  Mental and emotional symptoms: ? Worrying excessively about natural disasters. ? Fear of being late. ? Difficulty concentrating. ? Fears that others are judging your performance.  Physical symptoms: ? Fatigue. ? Headaches, muscle tension, muscle twitches, trembling, or feeling shaky. ? Feeling like your heart is pounding or beating very fast. ? Feeling out of breath or like you cannot take a deep breath. ? Having trouble falling asleep or staying asleep, or experiencing restlessness. ? Sweating. ? Nausea, diarrhea, or irritable bowel syndrome (IBS).  Behavioral symptoms: ? Experiencing erratic moods or irritability. ? Avoidance of new situations. ? Avoidance of  people. ? Extreme difficulty making decisions. How is this diagnosed? This condition is diagnosed based on your symptoms and medical history. You will also have a physical exam. Your health care provider may perform tests to rule out other possible causes of your symptoms. To be diagnosed with GAD, a person must have anxiety that:  Is out of his or her control.  Affects several different aspects of his or her life, such as work and relationships.  Causes distress that makes him or her unable to take part in normal activities.  Includes at least three symptoms of GAD, such as restlessness, fatigue, trouble concentrating, irritability, muscle tension, or sleep problems. Before your health care provider can confirm a diagnosis of GAD, these symptoms must be present more days than they are not, and they must last for 6 months or longer. How is this treated? This condition may be treated with:  Medicine. Antidepressant medicine is usually prescribed for long-term daily control. Anti-anxiety medicines may be added in severe cases, especially when panic attacks occur.  Talk therapy (psychotherapy). Certain types of talk therapy can be helpful in treating GAD by providing support, education, and guidance. Options include: ? Cognitive behavioral therapy (CBT). People learn coping skills and self-calming techniques to ease their physical symptoms. They learn to identify unrealistic thoughts and behaviors and to replace them with more appropriate thoughts and behaviors. ? Acceptance and commitment therapy (ACT). This treatment teaches people how to be mindful as a way to cope with unwanted thoughts and feelings. ? Biofeedback. This process trains you to manage your body's response (physiological response) through breathing techniques and relaxation methods. You will work with a therapist while machines are used to monitor your physical   symptoms.  Stress management techniques. These include yoga,  meditation, and exercise. A mental health specialist can help determine which treatment is best for you. Some people see improvement with one type of therapy. However, other people require a combination of therapies.   Follow these instructions at home: Lifestyle  Maintain a consistent routine and schedule.  Anticipate stressful situations. Create a plan, and allow extra time to work with your plan.  Practice stress management or self-calming techniques that you have learned from your therapist or your health care provider. General instructions  Take over-the-counter and prescription medicines only as told by your health care provider.  Understand that you are likely to have setbacks. Accept this and be kind to yourself as you persist to take better care of yourself.  Recognize and accept your accomplishments, even if you judge them as small.  Keep all follow-up visits as told by your health care provider. This is important. Contact a health care provider if:  Your symptoms do not get better.  Your symptoms get worse.  You have signs of depression, such as: ? A persistently sad or irritable mood. ? Loss of enjoyment in activities that used to bring you joy. ? Change in weight or eating. ? Changes in sleeping habits. ? Avoiding friends or family members. ? Loss of energy for normal tasks. ? Feelings of guilt or worthlessness. Get help right away if:  You have serious thoughts about hurting yourself or others. If you ever feel like you may hurt yourself or others, or have thoughts about taking your own life, get help right away. Go to your nearest emergency department or:  Call your local emergency services (911 in the [place]  Call a suicide crisis helpline, such as the National Suicide Prevention Lifeline at 1-800-273-8255. This is open 24 hours a day in the [place]  Text the Crisis Text Line at 741741 (in the [place] Summary  Generalized anxiety disorder (GAD) is a mental  health condition that involves worry that is not triggered by a specific event.  People with GAD often worry excessively about many things in their lives, such as their health and family.  GAD may cause symptoms such as restlessness, trouble concentrating, sleep problems, frequent sweating, nausea, diarrhea, headaches, and trembling or muscle twitching.  A mental health specialist can help determine which treatment is best for you. Some people see improvement with one type of therapy. However, other people require a combination of therapies. This information is not intended to replace advice given to you by your health care provider. Make sure you discuss any questions you have with your health care provider. Document Revised: 11/29/2018 Document Reviewed: 11/29/2018 Elsevier Patient Education  2021 [employer] Inc.  

## 2020-07-04 LAB — CYTOLOGY - PAP
Comment: NEGATIVE
Diagnosis: NEGATIVE
High risk HPV: NEGATIVE

## 2020-07-15 ENCOUNTER — Encounter: Payer: Self-pay | Admitting: Obstetrics and Gynecology

## 2020-07-15 ENCOUNTER — Telehealth: Payer: Self-pay | Admitting: Obstetrics and Gynecology

## 2020-07-15 NOTE — Telephone Encounter (Signed)
Pt aware of neg MyRisk results. IBIS=21.1%/riskscore=5.5%. Pt aware we can start mammos yearly age 30 due to IBIS score, but prefers to wait for now.   Patient understands these results only apply to her and her children, and this is not indicative of genetic testing results of her other family members. It is recommended that her other family members have genetic testing done.  Pt also understands negative genetic testing doesn't mean she will never get any of these cancers.   Hard copy will be mailed to pt once received. F/u prn.

## 2021-02-22 NOTE — L&D Delivery Note (Addendum)
Debra Hodge was completely dilated at approximately 1845, and began pushing shortly thereafter. Her second stage was marked by slow descent of the fetal head. Numerous techniques were employed to encourage her pushing effort, including the "rope', pulling with her legs back, and grabbing bedside handles. The fetal head was present crowning on the perineum for 30-40 minutes, presenting in an OA fashion.The FHTS showed moderate variability during this time period,, with early decels and variables, as well as some decels with late componant. Delivery: At 2022,  a viable female was delivered vaginally over a spontaneous 2nd degree perineal laceration. The baby presented OA, with restitution to ROT. A posterior arm and hand (compound) were noted with delivery of the anterior shoulder. The posterior shoulder delivered with gentle upward guidance. Spontaneous respirations were noted with delivery of the torso, and the infant was placed on the maternal abdomen APGAR: 8,9 ; weight: pending .   Placenta status: delivered at 2030 ,intact.  .  Cord: 3 vessel  with the following complications: none .   Epidural  Episiotomy:  none Lacerations:  2nd degree perineal Suture Repair: 3.0 vicryl rapide Est. Blood Loss (mL):  550  Mom to postpartum.  Baby to Couplet care / Skin to Skin.  Mirna Mires 10/28/2021, 9:26 PM

## 2021-03-03 ENCOUNTER — Other Ambulatory Visit: Payer: Self-pay

## 2021-03-03 ENCOUNTER — Encounter: Payer: Self-pay | Admitting: Obstetrics

## 2021-03-03 ENCOUNTER — Other Ambulatory Visit (HOSPITAL_COMMUNITY)
Admission: RE | Admit: 2021-03-03 | Discharge: 2021-03-03 | Disposition: A | Discharge status: Home / Self Care | Payer: BC Managed Care – PPO | Source: Ambulatory Visit | Attending: Obstetrics | Admitting: Obstetrics

## 2021-03-03 ENCOUNTER — Ambulatory Visit (INDEPENDENT_AMBULATORY_CARE_PROVIDER_SITE_OTHER): Payer: BC Managed Care – PPO | Admitting: Obstetrics

## 2021-03-03 VITALS — BP 122/74 | Wt 127.0 lb

## 2021-03-03 DIAGNOSIS — Z113 Encounter for screening for infections with a predominantly sexual mode of transmission: Secondary | ICD-10-CM | POA: Diagnosis not present

## 2021-03-03 DIAGNOSIS — Z3480 Encounter for supervision of other normal pregnancy, unspecified trimester: Secondary | ICD-10-CM | POA: Insufficient documentation

## 2021-03-03 DIAGNOSIS — F419 Anxiety disorder, unspecified: Secondary | ICD-10-CM

## 2021-03-03 DIAGNOSIS — Z3A01 Less than 8 weeks gestation of pregnancy: Secondary | ICD-10-CM

## 2021-03-03 DIAGNOSIS — Z3490 Encounter for supervision of normal pregnancy, unspecified, unspecified trimester: Secondary | ICD-10-CM | POA: Insufficient documentation

## 2021-03-03 MED ORDER — HYDROXYZINE HCL 25 MG PO TABS: 25.0000 mg | ORAL_TABLET | ORAL | 0 refills | Status: DC | PRN

## 2021-03-03 NOTE — Progress Notes (Signed)
NOB today. LMP 11/23

## 2021-03-03 NOTE — Progress Notes (Signed)
New Obstetric Patient H&P    Chief Complaint: "Desires prenatal care"   History of Present Illness: Patient is a 31 y.o. G1P0000 Not Hispanic or [place] female, LMP 01/14/2021 presents with amenorrhea and positive home pregnancy test. Based on her  LMP, her EDD is Estimated Date of Delivery: 10/21/21 and her EGA is [redacted]w[redacted]d Cycles are 6. days, regular, and occur approximately every : 28 days. Her last pap smear was about 1 years ago and was no abnormalities.    She had a urine pregnancy test which was positive about 3 week(s)  ago. Her last menstrual period was normal and lasted for  about 6 day(s). Since her LMP she claims she has experienced breast tenderness and nausea. She denies vaginal bleeding. Her past medical history is noncontributory. Her prior pregnancies are notable for none  Since her LMP, she admits to the use of tobacco products  Yes, but has quit. Previous MJ use She claims she has gained   no pounds since the start of her pregnancy.  There are cats in the home in the home  yes If yes She admits close contact with children on a regular basis  no  She has had chicken pox in the past no She has had Tuberculosis exposures, symptoms, or previously tested positive for TB   no Current or past history of domestic violence. no  Genetic Screening/Teratology Counseling: (Includes patient, baby's father, or anyone in either family with:)   [facility] 2. Thalassemia ([place] or Asian background): MCV<80  no 3. Neural tube defect (meningomyelocele, spina bifida, anencephaly)  no 4. Congenital heart defect  no  5. Down syndrome  no 6. Tay-Sachs ([place], [place]  no 7. Canavan's Disease  no 8. Sickle cell disease or trait (African)  no  9. Hemophilia or other blood disorders  no  10. Muscular dystrophy  no  11. Cystic fibrosis  no  12. Huntington's Chorea  no  13. Mental retardation/autism  no 14. Other inherited  genetic or chromosomal disorder  no 15. Maternal metabolic disorder (DM, PKU, etc)  no 16. Patient or FOB with a child with a birth defect not listed above no  16a. Patient or FOB with a birth defect themselves no 17. Recurrent pregnancy loss, or stillbirth  no  18. Any medications since LMP other than prenatal vitamins (include vitamins, supplements, OTC meds, drugs, alcohol)  no 19. Any other genetic/environmental exposure to discuss  no  Infection History:   1. Lives with someone with TB or TB exposed  no  2. Patient or partner has history of genital herpes  no 3. Rash or viral illness since LMP  no 4. History of STI (GC, CT, HPV, syphilis, HIV)  no 5. History of recent travel :  no  Other pertinent information:  yes  She lost both of her parents last year; her mother to endometrial cancer, and her father to suicide. She continues to grieve.    Review of Systems:10 point review of systems negative unless otherwise noted in HPI  Past Medical History:  Past Medical History:  Diagnosis Date   BRCA negative 06/2020   MyRisk neg   Depression    Family history of breast cancer    Family history of uterine cancer    in her mom   Increased risk of breast cancer 06/2020   IBIS=21.1%/riskscore=5.5%    Past Surgical History:  No past surgical  history on file.  Gynecologic History: Patient's last menstrual period was 01/14/2021 (exact date).  Obstetric History: G1P0000  Family History:  Family History  Problem Relation Age of Onset   Alcohol abuse Father    Depression Father    Colon cancer Maternal Uncle 17   Breast cancer Paternal Aunt 61   Arthritis Maternal Grandmother    Cancer Maternal Grandmother        lung cancer   Diabetes Maternal Grandmother    Arthritis Maternal Grandfather    Diabetes Maternal Grandfather    Alcohol abuse Paternal Grandmother    Endometrial cancer Mother 24    Social History:  Social History   Socioeconomic History    Marital status: Married    Spouse name: Not on file   Number of children: Not on file   Years of education: Not on file   Highest education level: Not on file  Occupational History   Not on file  Tobacco Use   Smoking status: Never   Smokeless tobacco: Never  Vaping Use   Vaping Use: Never used  Substance and Sexual Activity   Alcohol use: Yes    Alcohol/week: 0.0 standard drinks    Comment: occasionally   Drug use: No   Sexual activity: Yes    Birth control/protection: Pill, None  Other Topics Concern   Not on file  Social History Narrative   Not on file   Social Determinants of Health   Financial Resource Strain: Not on file  Food Insecurity: Not on file  Transportation Needs: Not on file  Physical Activity: Not on file  Stress: Not on file  Social Connections: Not on file  Intimate Partner Violence: Not on file    Allergies:  Allergies  Allergen Reactions   Shrimp [Shellfish Allergy] Other (See Comments)    Headache and vomiting    Medications: Prior to Admission medications   Medication Sig Start Date End Date Taking? Authorizing Provider  Prenatal Vit-Fe Fumarate-FA (MULTIVITAMIN-PRENATAL) 27-0.8 MG TABS tablet Take 1 tablet by mouth daily at 12 noon.   Yes [provider]  diazepam (VALIUM) 5 MG tablet Take 1 tab PO 1 hour prior to dental procedure Patient not taking: Reported on 03/03/2021 07/03/20   Jearld Fenton, NP    Physical Exam Vitals: Blood pressure 122/74, weight 127 lb (57.6 kg), last menstrual period 01/14/2021.  General: NAD HEENT: normocephalic, anicteric Thyroid: no enlargement, no palpable nodules Pulmonary: No increased work of breathing, CTAB Cardiovascular: RRR, distal pulses 2+ Abdomen: NABS, soft, non-tender, non-distended.  Umbilicus without lesions.  No hepatomegaly, splenomegaly or masses palpable. No evidence of hernia  Genitourinary:  External: Normal external female genitalia.  Normal urethral  meatus, normal  Bartholin's and Skene's glands.    Vagina: Normal vaginal mucosa, no evidence of prolapse.    Cervix: Grossly normal in appearance, no bleeding  Uterus: retroverted, Non-enlarged, mobile, normal contour.  No CMT  Adnexa: ovaries non-enlarged, no adnexal masses  Rectal: deferred Extremities: no edema, erythema, or tenderness Neurologic: Grossly intact Psychiatric: mood appropriate, affect full   Assessment: 31 y.o. G1P0000 at 41w6dpresenting to initiate prenatal care  Plan: 1) Avoid alcoholic beverages. 2) Patient encouraged not to smoke.  3) Discontinue the use of all non-medicinal drugs and chemicals.  4) Take prenatal vitamins daily.  5) Nutrition, food safety (fish, cheese advisories, and high nitrite foods) and exercise discussed. 6) Hospital and practice style discussed with cross coverage system.  7) Genetic Screening, such as with 1st Trimester  Screening, cell free fetal DNA, AFP testing, and Ultrasound, as well as with amniocentesis and CVS as appropriate, is discussed with patient. At the conclusion of today's visit patient undecided genetic testing  The following were addressed during this visit:  Breastfeeding Education - Early initiation of breastfeeding    Comments: Keeps milk supply adequate, helps contract uterus and slow bleeding, and early milk is the perfect first food and is easy to digest.   - The importance of exclusive breastfeeding    Comments: Provides antibodies, Lower risk of breast and ovarian cancers, and type-2 diabetes,Helps your body recover, Reduced chance of SIDS.   - Risks of giving your baby anything other than breast milk if you are breastfeeding    Comments: Make the baby less content with breastfeeds, may make my baby more susceptible to illness, and may reduce my milk supply.   - Nonpharmacological pain relief methods for labor    Comments: Deep breathing, focusing on pleasant things, movement and walking, heating pads or  cold compress, massage and relaxation, continuous support from someone you trust, and Doulas   - The importance of early skin-to-skin contact    Comments: Keeps baby warm and secure, helps keep babys blood sugar up and breathing steady, easier to bond and breastfeed, and helps calm baby.  - Rooming-in on a 24-hour basis    Comments: Easier to learn baby's feeding cues, easier to bond and get to know each other, and encourages milk production.   - Feeding on demand or baby-led feeding    Comments: Helps prevent breastfeeding complications, helps bring in good milk supply, prevents under or overfeeding, and helps baby feel content and satisfied   - Frequent feeding to help assure optimal milk production    Comments: Making a full supply of milk requires frequent removal of milk from breasts, infant will eat 8-12 times in 24 hours, if separated from infant use breast massage, hand expression and/ or pumping to remove milk from breasts.   - Effective positioning and attachment    Comments: Helps my baby to get enough breast milk, helps to produce an adequate milk supply, and helps prevent nipple pain and damage   - Exclusive breastfeeding for the first 6 months    Comments: Builds a healthy milk supply and keeps it up, protects baby from sickness and disease, and breastmilk has everything your baby needs for the first 6 months.  - Individualized Education    Comments: Contraindications to breastfeeding and other special medical conditions    8) Patient is asked about travel to areas at risk for the Zika virus, and counseled to avoid travel and exposure to mosquitoes or sexual partners who may have themselves been exposed to the virus. Testing is discussed, and will be ordered as appropriate.  No pap today, just cultures and urine gathered. Next visit, discuss MaternT testing, blood work. I have ordered her dating scan. Imagene Riches, CNM  03/03/2021 1:42 PM

## 2021-03-04 LAB — CERVICOVAGINAL ANCILLARY ONLY
Bacterial Vaginitis (gardnerella): NEGATIVE
Candida Glabrata: NEGATIVE
Candida Vaginitis: NEGATIVE
Chlamydia: NEGATIVE
Comment: NEGATIVE
Comment: NEGATIVE
Comment: NEGATIVE
Comment: NEGATIVE
Comment: NEGATIVE
Comment: NORMAL
Neisseria Gonorrhea: NEGATIVE
Trichomonas: NEGATIVE

## 2021-03-05 LAB — URINE CULTURE: Organism ID, Bacteria: NO GROWTH

## 2021-03-10 ENCOUNTER — Ambulatory Visit
Admission: RE | Admit: 2021-03-10 | Discharge: 2021-03-10 | Disposition: A | Discharge status: Home / Self Care | Payer: BC Managed Care – PPO | Source: Ambulatory Visit | Attending: Obstetrics | Admitting: Obstetrics

## 2021-03-10 ENCOUNTER — Other Ambulatory Visit: Payer: Self-pay

## 2021-03-10 DIAGNOSIS — O26841 Uterine size-date discrepancy, first trimester: Secondary | ICD-10-CM | POA: Diagnosis not present

## 2021-03-10 DIAGNOSIS — Z3A01 Less than 8 weeks gestation of pregnancy: Secondary | ICD-10-CM | POA: Diagnosis not present

## 2021-03-10 DIAGNOSIS — Z3480 Encounter for supervision of other normal pregnancy, unspecified trimester: Secondary | ICD-10-CM | POA: Diagnosis not present

## 2021-03-10 DIAGNOSIS — Z3481 Encounter for supervision of other normal pregnancy, first trimester: Secondary | ICD-10-CM | POA: Insufficient documentation

## 2021-03-17 DIAGNOSIS — D225 Melanocytic nevi of trunk: Secondary | ICD-10-CM | POA: Diagnosis not present

## 2021-03-17 DIAGNOSIS — L821 Other seborrheic keratosis: Secondary | ICD-10-CM | POA: Diagnosis not present

## 2021-03-31 ENCOUNTER — Other Ambulatory Visit: Payer: Self-pay

## 2021-03-31 ENCOUNTER — Ambulatory Visit (INDEPENDENT_AMBULATORY_CARE_PROVIDER_SITE_OTHER): Payer: Medicaid Other | Admitting: Obstetrics

## 2021-03-31 VITALS — BP 126/80 | Wt 129.0 lb

## 2021-03-31 DIAGNOSIS — Z3480 Encounter for supervision of other normal pregnancy, unspecified trimester: Secondary | ICD-10-CM

## 2021-03-31 DIAGNOSIS — Z3A10 10 weeks gestation of pregnancy: Secondary | ICD-10-CM

## 2021-03-31 DIAGNOSIS — Z1379 Encounter for other screening for genetic and chromosomal anomalies: Secondary | ICD-10-CM

## 2021-03-31 LAB — POCT URINALYSIS DIPSTICK
Glucose, UA: NEGATIVE
Protein, UA: NEGATIVE

## 2021-03-31 NOTE — Progress Notes (Signed)
No vb. No lof. Dating scan f/u

## 2021-03-31 NOTE — Addendum Note (Signed)
Addended by: Liliane Shi on: 03/31/2021 11:09 AM   Modules accepted: Orders

## 2021-03-31 NOTE — Progress Notes (Signed)
Routine Prenatal Care Visit  Subjective  Debra Hodge is a 31 y.o. G1P0000 at [redacted]w[redacted]d being seen today for ongoing prenatal care.  She is currently monitored for the following issues for this low-risk pregnancy and has Anxiety and depression and Supervision of other normal pregnancy, antepartum on their problem list.  ----------------------------------------------------------------------------------- Patient reports no complaints.    . Vag. Bleeding: None.   . Leaking Fluid denies.  ----------------------------------------------------------------------------------- The following portions of the patient's history were reviewed and updated as appropriate: allergies, current medications, past family history, past medical history, past social history, past surgical history and problem list. Problem list updated.  Objective  Blood pressure 126/80, weight 129 lb (58.5 kg), last menstrual period 01/14/2021. Pregravid weight Pregravid weight not on file Total Weight Gain Not found. Urinalysis: Urine Protein    Urine Glucose    Fetal Status:           General:  Alert, oriented and cooperative. Patient is in no acute distress.  Skin: Skin is warm and dry. No rash noted.   Cardiovascular: Normal heart rate noted  Respiratory: Normal respiratory effort, no problems with respiration noted  Abdomen: Soft, gravid, appropriate for gestational age.       Pelvic:  Cervical exam deferred        Extremities: Normal range of motion.     Mental Status: Normal mood and affect. Normal behavior. Normal judgment and thought content.   Assessment   31 y.o. G1P0000 at [redacted]w[redacted]d by  10/21/2021, by Last Menstrual Period presenting for routine prenatal visit  Plan   pregnancy Problems (from 03/03/21 to present)    Problem Noted Resolved   Supervision of other normal pregnancy, antepartum 03/03/2021 by Mirna Mires, CNM No   Overview Addendum 03/31/2021 10:42 AM by Mirna Mires, CNM     Nursing Staff  Provider  Office Location  [facility] Dating   CS LMP dating. EDD 10/21/2021  Language  English Anatomy US    Flu Vaccine   Genetic Screen  NIPS:   TDaP vaccine    Hgb A1C or  GTT Early : Third trimester :   Covid    LAB RESULTS   Rhogam   Blood Type     Feeding Plan  Antibody    Contraception  Rubella    Circumcision  RPR     Pediatrician   HBsAg     Support Person  HIV    Prenatal Classes  Varicella     GBS  (For PCN allergy, check sensitivities)   BTL Consent     VBAC Consent  Pap      Hgb Electro      CF      SMA                   Preterm labor symptoms and general obstetric precautions including but not limited to vaginal bleeding, contractions, leaking of fluid and fetal movement were reviewed in detail with the patient. Please refer to After Visit Summary for other counseling recommendations.  She is feeling better, far less nauseous. Has had some unusual dreams :)   MaternT testing today with her prenatal labs  No follow-ups on file.  Mirna Mires, CNM  03/31/2021 10:43 AM

## 2021-04-01 LAB — CBC/D/PLT+RPR+RH+ABO+RUBIGG...
Antibody Screen: NEGATIVE
Basophils Absolute: 0.1 10*3/uL (ref 0.0–0.2)
Basos: 1 %
EOS (ABSOLUTE): 0.2 10*3/uL (ref 0.0–0.4)
Eos: 1 %
HCV Ab: <0.1 s/co ratio (ref 0.0–0.9)
HIV Screen 4th Generation wRfx: NONREACTIVE
Hematocrit: 41.3 % (ref 34.0–46.6)
Hemoglobin: 14.4 g/dL (ref 11.1–15.9)
Hepatitis B Surface Ag: NEGATIVE
Immature Grans (Abs): 0.1 10*3/uL (ref 0.0–0.1)
Immature Granulocytes: 1 %
Lymphocytes Absolute: 1.8 10*3/uL (ref 0.7–3.1)
Lymphs: 17 %
MCH: 31.6 pg (ref 26.6–33.0)
MCHC: 34.9 g/dL (ref 31.5–35.7)
MCV: 91 fL (ref 79–97)
Monocytes Absolute: 0.7 10*3/uL (ref 0.1–0.9)
Monocytes: 6 %
Neutrophils Absolute: 7.8 10*3/uL — ABNORMAL HIGH (ref 1.4–7.0)
Neutrophils: 74 %
Platelets: 436 10*3/uL (ref 150–450)
RBC: 4.55 x10E6/uL (ref 3.77–5.28)
RDW: 12.4 % (ref 11.7–15.4)
RPR Ser Ql: NONREACTIVE
Rh Factor: POSITIVE
Rubella Antibodies, IGG: 2.22 index (ref >0.99)
Varicella zoster IgG: <135 index — ABNORMAL LOW (ref >165)
WBC: 10.5 10*3/uL (ref 3.4–10.8)

## 2021-04-01 LAB — HCV INTERPRETATION

## 2021-04-05 LAB — MATERNIT 21 PLUS CORE, BLOOD
Fetal Fraction: 4
Result (T21): NEGATIVE
Trisomy 13 (Patau syndrome): NEGATIVE
Trisomy 18 (Edwards syndrome): NEGATIVE
Trisomy 21 (Down syndrome): NEGATIVE

## 2021-04-28 ENCOUNTER — Encounter: Payer: Self-pay | Admitting: Advanced Practice Midwife

## 2021-04-28 ENCOUNTER — Ambulatory Visit (INDEPENDENT_AMBULATORY_CARE_PROVIDER_SITE_OTHER): Payer: Medicaid Other | Admitting: Advanced Practice Midwife

## 2021-04-28 ENCOUNTER — Other Ambulatory Visit: Payer: Self-pay

## 2021-04-28 VITALS — BP 120/70 | Wt 136.0 lb

## 2021-04-28 DIAGNOSIS — Z3A14 14 weeks gestation of pregnancy: Secondary | ICD-10-CM

## 2021-04-28 DIAGNOSIS — Z3689 Encounter for other specified antenatal screening: Secondary | ICD-10-CM

## 2021-04-28 DIAGNOSIS — Z3480 Encounter for supervision of other normal pregnancy, unspecified trimester: Secondary | ICD-10-CM

## 2021-04-28 DIAGNOSIS — Z369 Encounter for antenatal screening, unspecified: Secondary | ICD-10-CM

## 2021-04-28 LAB — POCT URINALYSIS DIPSTICK OB
Glucose, UA: NEGATIVE
POC,PROTEIN,UA: NEGATIVE

## 2021-04-28 NOTE — Patient Instructions (Signed)

## 2021-04-28 NOTE — Progress Notes (Signed)
Routine Prenatal Care Visit ? ?Subjective  ?Debra Hodge is a 31 y.o. G1P0000 at [redacted]w[redacted]d being seen today for ongoing prenatal care.  She is currently monitored for the following issues for this low-risk pregnancy and has Anxiety and depression and Supervision of other normal pregnancy, antepartum on their problem list.  ?----------------------------------------------------------------------------------- ?Patient reports doing well. She is still having some fatigue and nausea, however, those symptoms are improving.   ?Contractions: Not present. Vag. Bleeding: None.  Movement: Absent. Leaking Fluid denies.  ?----------------------------------------------------------------------------------- ?The following portions of the patient's history were reviewed and updated as appropriate: allergies, current medications, past family history, past medical history, past social history, past surgical history and problem list. Problem list updated. ? ?Objective  ?Blood pressure 120/70, weight 136 lb (61.7 kg), last menstrual period 01/14/2021. ?Pregravid weight 127 lb (57.6 kg) Total Weight Gain 9 lb (4.082 kg) ?Urinalysis: Urine Protein    Urine Glucose   ? ?Fetal Status: Fetal Heart Rate (bpm): 154   Movement: Absent    ? ?General:  Alert, oriented and cooperative. Patient is in no acute distress.  ?Skin: Skin is warm and dry. No rash noted.   ?Cardiovascular: Normal heart rate noted  ?Respiratory: Normal respiratory effort, no problems with respiration noted  ?Abdomen: Soft, gravid, appropriate for gestational age. Pain/Pressure: Absent     ?Pelvic:  Cervical exam deferred        ?Extremities: Normal range of motion.  Edema: None  ?Mental Status: Normal mood and affect. Normal behavior. Normal judgment and thought content.  ? ?Assessment  ? ?31 y.o. G1P0000 at [redacted]w[redacted]d by  10/21/2021, by Last Menstrual Period presenting for routine prenatal visit ? ?Plan  ? ?pregnancy Problems (from 03/03/21 to present)   ? Problem Noted Resolved   ? Supervision of other normal pregnancy, antepartum 03/03/2021 by Mirna Mires, CNM No  ? Overview Addendum 04/09/2021  5:10 PM by Mirna Mires, CNM  ?   ?Nursing Staff Provider  ?Office Location  [facility] Dating   CS LMP dating. EDD 10/21/2021  ?Language  English Anatomy US    ?Flu Vaccine   Genetic Screen  NIPS: maternT neg, xx  ?TDaP vaccine    Hgb A1C or  ?GTT Early : ?Third trimester :   ?Covid    LAB RESULTS   ?Rhogam   Blood Type     ?Feeding Plan  Antibody    ?Contraception  Rubella    ?Circumcision  RPR     ?Pediatrician   HBsAg     ?Support Person  HIV    ?Prenatal Classes  Varicella   ?  GBS  (For PCN allergy, check sensitivities)   ?BTL Consent     ?VBAC Consent  Pap    ?  Hgb Electro    ?  CF   ?   SMA   ?     ? ? ?  ?  ?  ?  ? ?Preterm labor symptoms and general obstetric precautions including but not limited to vaginal bleeding, contractions, leaking of fluid and fetal movement were reviewed in detail with the patient. ?Please refer to After Visit Summary for other counseling recommendations.  ? ?Return in about 4 weeks (around 05/26/2021) for anatomy scan and rob. ? ?Tresea Mall, CNM ?04/28/2021 10:34 AM   ? ?

## 2021-04-28 NOTE — Addendum Note (Signed)
Addended by: Cornelius Moras D on: 04/28/2021 10:50 AM ? ? Modules accepted: Orders ? ?

## 2021-05-14 ENCOUNTER — Ambulatory Visit
Admission: RE | Admit: 2021-05-14 | Discharge: 2021-05-14 | Disposition: A | Discharge status: Home / Self Care | Payer: Medicaid Other | Source: Ambulatory Visit | Attending: Advanced Practice Midwife | Admitting: Advanced Practice Midwife

## 2021-05-14 ENCOUNTER — Other Ambulatory Visit: Payer: Self-pay

## 2021-05-14 DIAGNOSIS — Z369 Encounter for antenatal screening, unspecified: Secondary | ICD-10-CM

## 2021-05-14 DIAGNOSIS — Z3689 Encounter for other specified antenatal screening: Secondary | ICD-10-CM | POA: Insufficient documentation

## 2021-05-14 DIAGNOSIS — Z3A17 17 weeks gestation of pregnancy: Secondary | ICD-10-CM | POA: Diagnosis not present

## 2021-05-14 DIAGNOSIS — Z3480 Encounter for supervision of other normal pregnancy, unspecified trimester: Secondary | ICD-10-CM

## 2021-05-26 ENCOUNTER — Encounter: Payer: Medicaid Other | Admitting: Advanced Practice Midwife

## 2021-05-27 ENCOUNTER — Encounter: Payer: Self-pay | Admitting: Advanced Practice Midwife

## 2021-05-27 ENCOUNTER — Ambulatory Visit (INDEPENDENT_AMBULATORY_CARE_PROVIDER_SITE_OTHER): Payer: Medicaid Other | Admitting: Advanced Practice Midwife

## 2021-05-27 VITALS — BP 102/60 | Wt 144.0 lb

## 2021-05-27 DIAGNOSIS — Z3482 Encounter for supervision of other normal pregnancy, second trimester: Secondary | ICD-10-CM

## 2021-05-27 DIAGNOSIS — Z3A19 19 weeks gestation of pregnancy: Secondary | ICD-10-CM

## 2021-05-27 LAB — POCT URINALYSIS DIPSTICK OB
Glucose, UA: NEGATIVE
POC,PROTEIN,UA: NEGATIVE

## 2021-05-27 NOTE — Progress Notes (Signed)
ROB- no concerns 

## 2021-05-27 NOTE — Addendum Note (Signed)
Addended by: Drenda Freeze on: 05/27/2021 09:38 AM ? ? Modules accepted: Orders ? ?

## 2021-05-27 NOTE — Progress Notes (Signed)
Routine Prenatal Care Visit ? ?Subjective  ?Debra Hodge is a 31 y.o. G1P0000 at [redacted]w[redacted]d being seen today for ongoing prenatal care.  She is currently monitored for the following issues for this low-risk pregnancy and has Anxiety and depression and Supervision of other normal pregnancy, antepartum on their problem list.  ?----------------------------------------------------------------------------------- ?Patient reports some lower pelvic pain most likely round ligament. Reviewed upcoming visit information.  ?Contractions: Not present. Vag. Bleeding: None.  Movement: Absent. Leaking Fluid denies.  ?----------------------------------------------------------------------------------- ?The following portions of the patient's history were reviewed and updated as appropriate: allergies, current medications, past family history, past medical history, past social history, past surgical history and problem list. Problem list updated. ? ?Objective  ?Blood pressure 102/60, weight 144 lb (65.3 kg), last menstrual period 01/14/2021. ?Pregravid weight 127 lb (57.6 kg) Total Weight Gain 17 lb (7.711 kg) ?Urinalysis: Urine Protein    Urine Glucose   ? ?Fetal Status: Fetal Heart Rate (bpm): 152 Fundal Height: 19 cm Movement: Absent    ? ?General:  Alert, oriented and cooperative. Patient is in no acute distress.  ?Skin: Skin is warm and dry. No rash noted.   ?Cardiovascular: Normal heart rate noted  ?Respiratory: Normal respiratory effort, no problems with respiration noted  ?Abdomen: Soft, gravid, appropriate for gestational age. Pain/Pressure: Absent     ?Pelvic:  Cervical exam deferred        ?Extremities: Normal range of motion.  Edema: None  ?Mental Status: Normal mood and affect. Normal behavior. Normal judgment and thought content.  ? ?Assessment  ? ?31 y.o. G1P0000 at [redacted]w[redacted]d by  10/21/2021, by Last Menstrual Period presenting for routine prenatal visit ? ?Plan  ? ?pregnancy Problems (from 03/03/21 to present)   ? Problem  Noted Resolved  ? Supervision of other normal pregnancy, antepartum 03/03/2021 by Imagene Riches, CNM No  ? Overview Addendum 04/09/2021  5:10 PM by Imagene Riches, CNM  ?   ?Nursing Staff Provider  ?Office Location  [facility] Dating   CS LMP dating. EDD 10/21/2021  ?Language  English Anatomy US    ?Flu Vaccine   Genetic Screen  NIPS: maternT neg, xx  ?TDaP vaccine    Hgb A1C or  ?GTT Early : ?Third trimester :   ?Covid    LAB RESULTS   ?Rhogam   Blood Type     ?Feeding Plan  Antibody    ?Contraception  Rubella    ?Circumcision  RPR     ?Pediatrician   HBsAg     ?Support Person  HIV    ?Prenatal Classes  Varicella   ?  GBS  (For PCN allergy, check sensitivities)   ?BTL Consent     ?VBAC Consent  Pap    ?  Hgb Electro    ?  CF   ?   SMA   ?     ? ? ?  ?  ?  ?  ? ?Preterm labor symptoms and general obstetric precautions including but not limited to vaginal bleeding, contractions, leaking of fluid and fetal movement were reviewed in detail with the patient. ?Please refer to After Visit Summary for other counseling recommendations.  ? ?Return in about 4 weeks (around 06/24/2021) for rob. ? ?Rod Can, CNM ?05/27/2021 9:35 AM   ? ?

## 2021-06-24 ENCOUNTER — Ambulatory Visit (INDEPENDENT_AMBULATORY_CARE_PROVIDER_SITE_OTHER): Payer: Medicaid Other | Admitting: Obstetrics and Gynecology

## 2021-06-24 ENCOUNTER — Encounter: Payer: Self-pay | Admitting: Obstetrics and Gynecology

## 2021-06-24 VITALS — BP 118/70 | Wt 155.0 lb

## 2021-06-24 DIAGNOSIS — Z3480 Encounter for supervision of other normal pregnancy, unspecified trimester: Secondary | ICD-10-CM

## 2021-06-24 NOTE — Progress Notes (Signed)
? ?  PRENATAL VISIT NOTE ? ?Subjective:  ?Debra Hodge is a 31 y.o. G1P0000 at [redacted]w[redacted]d being seen today for ongoing prenatal care.  She is currently monitored for the following issues for this low-risk pregnancy and has Anxiety and depression and Supervision of other normal pregnancy, antepartum on their problem list. ? ?Patient reports no complaints.  Contractions: Not present. Vag. Bleeding: None.  Movement: Present. Denies leaking of fluid.  ? ?The following portions of the patient's history were reviewed and updated as appropriate: allergies, current medications, past family history, past medical history, past social history, past surgical history and problem list.  ? ?Objective:  ? ?Vitals:  ? 06/24/21 0932  ?BP: 118/70  ?Weight: 155 lb (70.3 kg)  ? ? ?Fetal Status: Fetal Heart Rate (bpm): 151 Fundal Height: 23 cm Movement: Present    ? ?General:  Alert, oriented and cooperative. Patient is in no acute distress.  ?Skin: Skin is warm and dry. No rash noted.   ?Cardiovascular: Normal heart rate noted  ?Respiratory: Normal respiratory effort, no problems with respiration noted  ?Abdomen: Soft, gravid, appropriate for gestational age.  Pain/Pressure: Absent     ?Pelvic: Cervical exam deferred        ?Extremities: Normal range of motion.     ?Mental Status: Normal mood and affect. Normal behavior. Normal judgment and thought content.  ? ?Assessment and Plan:  ?Pregnancy: G1P0000 at [redacted]w[redacted]d ?1. Supervision of other normal pregnancy, antepartum ?Patient is doing well without complaints ?Anatomy ultrasound reviewed with the patient ?Third trimester labs and glucola next visit ? ?Preterm labor symptoms and general obstetric precautions including but not limited to vaginal bleeding, contractions, leaking of fluid and fetal movement were reviewed in detail with the patient. ?Please refer to After Visit Summary for other counseling recommendations.  ? ?Return in about 4 weeks (around 07/22/2021) for in person, ROB, Low risk,  glucola next visit. ? ?No future appointments. ? ?Catalina Antigua, MD ? ?

## 2021-06-24 NOTE — Progress Notes (Signed)
No vb. No lof.  

## 2021-07-01 ENCOUNTER — Other Ambulatory Visit: Payer: Self-pay | Admitting: Obstetrics and Gynecology

## 2021-07-01 ENCOUNTER — Other Ambulatory Visit: Payer: Medicaid Other

## 2021-07-01 DIAGNOSIS — Z3480 Encounter for supervision of other normal pregnancy, unspecified trimester: Secondary | ICD-10-CM

## 2021-07-02 LAB — GLUCOSE, 1 HOUR GESTATIONAL: Gestational Diabetes Screen: 128 mg/dL (ref 70–139)

## 2021-07-22 ENCOUNTER — Ambulatory Visit (INDEPENDENT_AMBULATORY_CARE_PROVIDER_SITE_OTHER): Payer: Medicaid Other | Admitting: Licensed Practical Nurse

## 2021-07-22 ENCOUNTER — Encounter: Payer: Self-pay | Admitting: Licensed Practical Nurse

## 2021-07-22 VITALS — BP 118/60 | Wt 162.0 lb

## 2021-07-22 DIAGNOSIS — Z3A27 27 weeks gestation of pregnancy: Secondary | ICD-10-CM

## 2021-07-22 DIAGNOSIS — Z3480 Encounter for supervision of other normal pregnancy, unspecified trimester: Secondary | ICD-10-CM

## 2021-07-22 LAB — POCT URINALYSIS DIPSTICK OB
Glucose, UA: NEGATIVE
POC,PROTEIN,UA: NEGATIVE

## 2021-07-22 NOTE — Progress Notes (Signed)
Routine Prenatal Care Visit  Subjective  Debra Hodge is a 31 y.o. G1P0000 at [redacted]w[redacted]d being seen today for ongoing prenatal care.  She is currently monitored for the following issues for this low-risk pregnancy and has Anxiety and depression and Supervision of other normal pregnancy, antepartum on their problem list.  ----------------------------------------------------------------------------------- Patient reports no complaints.  Signed up for CBE, breastfeeding and CPR classes.  Sleep is disturbed, but feels rested.  Wynelle Fanny has been good.  Moving near in laws [facility]) after the baby is born.  Had one hour test last visit, result 128  Contractions: Not present. Vag. Bleeding: None.  Movement: Present. Leaking Fluid denies.  ----------------------------------------------------------------------------------- The following portions of the patient's history were reviewed and updated as appropriate: allergies, current medications, past family history, past medical history, past social history, past surgical history and problem list. Problem list updated.  Objective  Blood pressure 118/60, weight 162 lb (73.5 kg), last menstrual period 01/14/2021. Pregravid weight 127 lb (57.6 kg) Total Weight Gain 35 lb (15.9 kg) Urinalysis: Urine Protein    Urine Glucose    Fetal Status: Fetal Heart Rate (bpm): 150 Fundal Height: 28 cm Movement: Present     General:  Alert, oriented and cooperative. Patient is in no acute distress.  Skin: Skin is warm and dry. No rash noted.   Cardiovascular: Normal heart rate noted  Respiratory: Normal respiratory effort, no problems with respiration noted  Abdomen: Soft, gravid, appropriate for gestational age. Pain/Pressure: Present     Pelvic:  Cervical exam deferred        Extremities: Normal range of motion.  Edema: None  Mental Status: Normal mood and affect. Normal behavior. Normal judgment and thought content.   Assessment   31 y.o. G1P0000 at [redacted]w[redacted]d by   10/21/2021, by Last Menstrual Period presenting for routine prenatal visit  Plan   pregnancy Problems (from 03/03/21 to present)     Problem Noted Resolved   Supervision of other normal pregnancy, antepartum 03/03/2021 by Imagene Riches, CNM No   Overview Addendum 07/02/2021  9:41 AM by Mora Bellman, MD     Nursing Staff Provider  Office Location  [facility] Dating   CS LMP dating. EDD 10/21/2021  Language  English Anatomy US  normal  Flu Vaccine   Genetic Screen  NIPS: maternT neg, xx  TDaP vaccine    Hgb A1C or  GTT Early : Third trimester : 1 hr 128  Covid    LAB RESULTS   Rhogam   Blood Type O/Positive/-- (02/07 1108)   Feeding Plan  Antibody Negative (02/07 1108)  Contraception  Rubella 2.22 (02/07 1108)  Circumcision  RPR Non Reactive (02/07 1108)   Pediatrician   HBsAg Negative (02/07 1108)   Support Person  HIV Non Reactive (02/07 1108)  Prenatal Classes  Varicella     GBS  (For PCN allergy, check sensitivities)   BTL Consent     VBAC Consent  Pap      Hgb Electro      CF      SMA                    Preterm labor symptoms and general obstetric precautions including but not limited to vaginal bleeding, contractions, leaking of fluid and fetal movement were reviewed in detail with the patient. Please refer to After Visit Summary for other counseling recommendations.   Return in about 2 weeks (around 08/05/2021) for [place].  Roberto Scales, CNM  [facility],  [facility]  07/22/21  10:28 AM

## 2021-08-05 ENCOUNTER — Ambulatory Visit (INDEPENDENT_AMBULATORY_CARE_PROVIDER_SITE_OTHER): Payer: Medicaid Other | Admitting: Obstetrics

## 2021-08-05 VITALS — BP 116/70 | Wt 166.0 lb

## 2021-08-05 DIAGNOSIS — Z3480 Encounter for supervision of other normal pregnancy, unspecified trimester: Secondary | ICD-10-CM

## 2021-08-05 DIAGNOSIS — Z3A29 29 weeks gestation of pregnancy: Secondary | ICD-10-CM

## 2021-08-05 NOTE — Progress Notes (Signed)
Routine Prenatal Care Visit  Subjective  Debra Hodge is a 31 y.o. G1P0000 at [redacted]w[redacted]d being seen today for ongoing prenatal care.  She is currently monitored for the following issues for this low-risk pregnancy and has Anxiety and depression and Supervision of other normal pregnancy, antepartum on their problem list.  ----------------------------------------------------------------------------------- Patient reports no complaints.   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 She and her husband plan to relocate to [facility] at some point after she delivers as his parents live there. Contractions: Not present. Vag. Bleeding: None.  Movement: Present. Leaking Fluid denies.  ----------------------------------------------------------------------------------- The following portions of the patient's history were reviewed and updated as appropriate: allergies, current medications, past family history, past medical history, past social history, past surgical history and problem list. Problem list updated.  Objective  Blood pressure 116/70, weight 166 lb (75.3 kg), last menstrual period 01/14/2021. Pregravid weight 127 lb (57.6 kg) Total Weight Gain 39 lb (17.7 kg) Urinalysis: Urine Protein    Urine Glucose    Fetal Status:     Movement: Present     General:  Alert, oriented and cooperative. Patient is in no acute distress.  Skin: Skin is warm and dry. No rash noted.   Cardiovascular: Normal heart rate noted  Respiratory: Normal respiratory effort, no problems with respiration noted  Abdomen: Soft, gravid, appropriate for gestational age. Pain/Pressure: Absent     Pelvic:  Cervical exam deferred        Extremities: Normal range of motion.     Mental Status: Normal mood and affect. Normal behavior. Normal judgment and thought content.   Assessment   31 y.o. G1P0000 at 108w0d by  10/21/2021, by Last Menstrual Period presenting for routine  prenatal visit  Plan   pregnancy Problems (from 03/03/21 to present)    Problem Noted Resolved   Supervision of other normal pregnancy, antepartum 03/03/2021 by Mirna Mires, CNM No   Overview Addendum 07/02/2021  9:41 AM by Catalina Antigua, MD     Nursing Staff Provider  Office Location  [facility] Dating   CS LMP dating. EDD 10/21/2021  Language  English Anatomy US  normal  Flu Vaccine   Genetic Screen  NIPS: maternT neg, xx  TDaP vaccine    Hgb A1C or  GTT Early : Third trimester : 1 hr 128  Covid    LAB RESULTS   Rhogam   Blood Type O/Positive/-- (02/07 1108)   Feeding Plan  Antibody Negative (02/07 1108)  Contraception  Rubella 2.22 (02/07 1108)  Circumcision  RPR Non Reactive (02/07 1108)   Pediatrician   HBsAg Negative (02/07 1108)   Support Person  HIV Non Reactive (02/07 1108)  Prenatal Classes  Varicella     GBS  (For PCN allergy, check sensitivities)   BTL Consent     VBAC Consent  Pap      Hgb Electro      CF      SMA                   Preterm labor symptoms and general obstetric precautions including but not limited to vaginal bleeding, contractions, leaking of fluid and fetal movement were reviewed in detail with the patient. Please refer to After Visit Summary for other counseling recommendations.   Return in about 2 weeks (around 08/19/2021) for return OB.  Mirna Mires, CNM  08/05/2021 8:22 AM

## 2021-08-05 NOTE — Progress Notes (Signed)
No vb. No lof. TDAP at [place].  °

## 2021-08-19 ENCOUNTER — Ambulatory Visit (INDEPENDENT_AMBULATORY_CARE_PROVIDER_SITE_OTHER): Payer: Medicaid Other | Admitting: Obstetrics

## 2021-08-19 VITALS — BP 100/60 | Wt 166.0 lb

## 2021-08-19 DIAGNOSIS — Z3480 Encounter for supervision of other normal pregnancy, unspecified trimester: Secondary | ICD-10-CM

## 2021-08-19 DIAGNOSIS — Z3A31 31 weeks gestation of pregnancy: Secondary | ICD-10-CM

## 2021-08-19 NOTE — Progress Notes (Signed)
Routine Prenatal Care Visit  Subjective  Debra Hodge is a 31 y.o. G1P0000 at [redacted]w[redacted]d being seen today for ongoing prenatal care.  She is currently monitored for the following issues for this low-risk pregnancy and has Anxiety and depression and Supervision of other normal pregnancy, antepartum on their problem list.  ----------------------------------------------------------------------------------- Patient reports no complaints.   Contractions: Not present. Vag. Bleeding: None.  Movement: Present. Leaking Fluid denies.  ----------------------------------------------------------------------------------- The following portions of the patient's history were reviewed and updated as appropriate: allergies, current medications, past family history, past medical history, past social history, past surgical history and problem list. Problem list updated.  Objective  Blood pressure 100/60, weight 166 lb (75.3 kg), last menstrual period 01/14/2021. Pregravid weight 127 lb (57.6 kg) Total Weight Gain 39 lb (17.7 kg) Urinalysis: Urine Protein    Urine Glucose    Fetal Status:     Movement: Present     General:  Alert, oriented and cooperative. Patient is in no acute distress.  Skin: Skin is warm and dry. No rash noted.   Cardiovascular: Normal heart rate noted  Respiratory: Normal respiratory effort, no problems with respiration noted  Abdomen: Soft, gravid, appropriate for gestational age. Pain/Pressure: Present     Pelvic:  Cervical exam deferred        Extremities: Normal range of motion.     Mental Status: Normal mood and affect. Normal behavior. Normal judgment and thought content.   Assessment   31 y.o. G1P0000 at [redacted]w[redacted]d by  10/21/2021, by Last Menstrual Period presenting for routine prenatal visit  Plan   pregnancy Problems (from 03/03/21 to present)    Problem Noted Resolved   Supervision of other normal pregnancy, antepartum 03/03/2021 by Mirna Mires, CNM No   Overview Addendum  07/02/2021  9:41 AM by Catalina Antigua, MD     Nursing Staff Provider  Office Location  [facility] Dating   CS LMP dating. EDD 10/21/2021  Language  English Anatomy US  normal  Flu Vaccine   Genetic Screen  NIPS: maternT neg, xx  TDaP vaccine    Hgb A1C or  GTT Early : Third trimester : 1 hr 128  Covid    LAB RESULTS   Rhogam   Blood Type O/Positive/-- (02/07 1108)   Feeding Plan  Antibody Negative (02/07 1108)  Contraception  Rubella 2.22 (02/07 1108)  Circumcision  RPR Non Reactive (02/07 1108)   Pediatrician   HBsAg Negative (02/07 1108)   Support Person  HIV Non Reactive (02/07 1108)  Prenatal Classes  Varicella     GBS  (For PCN allergy, check sensitivities)   BTL Consent     VBAC Consent  Pap      Hgb Electro      CF      SMA                   Preterm labor symptoms and general obstetric precautions including but not limited to vaginal bleeding, contractions, leaking of fluid and fetal movement were reviewed in detail with the patient. Please refer to After Visit Summary for other counseling recommendations.  CBC drawn today as it was not done at her 28 week visit. Will have tdap next visit.  No follow-ups on file.  Mirna Mires, CNM  08/19/2021 8:56 AM

## 2021-08-20 LAB — CBC WITH DIFFERENTIAL
Basophils Absolute: 0.1 10*3/uL (ref 0.0–0.2)
Basos: 1 %
EOS (ABSOLUTE): 0.2 10*3/uL (ref 0.0–0.4)
Eos: 2 %
Hematocrit: 34.3 % (ref 34.0–46.6)
Hemoglobin: 12.3 g/dL (ref 11.1–15.9)
Immature Grans (Abs): 0.1 10*3/uL (ref 0.0–0.1)
Immature Granulocytes: 1 %
Lymphocytes Absolute: 1.9 10*3/uL (ref 0.7–3.1)
Lymphs: 15 %
MCH: 31.8 pg (ref 26.6–33.0)
MCHC: 35.9 g/dL — ABNORMAL HIGH (ref 31.5–35.7)
MCV: 89 fL (ref 79–97)
Monocytes Absolute: 0.9 10*3/uL (ref 0.1–0.9)
Monocytes: 7 %
Neutrophils Absolute: 9.8 10*3/uL — ABNORMAL HIGH (ref 1.4–7.0)
Neutrophils: 74 %
RBC: 3.87 x10E6/uL (ref 3.77–5.28)
RDW: 12.4 % (ref 11.7–15.4)
WBC: 13 10*3/uL — ABNORMAL HIGH (ref 3.4–10.8)

## 2021-09-01 ENCOUNTER — Ambulatory Visit (INDEPENDENT_AMBULATORY_CARE_PROVIDER_SITE_OTHER): Payer: Medicaid Other | Admitting: Advanced Practice Midwife

## 2021-09-01 ENCOUNTER — Encounter: Payer: Self-pay | Admitting: Advanced Practice Midwife

## 2021-09-01 VITALS — BP 110/70 | Wt 170.0 lb

## 2021-09-01 DIAGNOSIS — Z23 Encounter for immunization: Secondary | ICD-10-CM | POA: Diagnosis not present

## 2021-09-01 DIAGNOSIS — Z3A32 32 weeks gestation of pregnancy: Secondary | ICD-10-CM

## 2021-09-01 DIAGNOSIS — Z3403 Encounter for supervision of normal first pregnancy, third trimester: Secondary | ICD-10-CM

## 2021-09-01 LAB — POCT URINALYSIS DIPSTICK OB
Glucose, UA: NEGATIVE
POC,PROTEIN,UA: NEGATIVE

## 2021-09-01 NOTE — Addendum Note (Signed)
Addended by: Cornelius Moras D on: 09/01/2021 08:37 AM   Modules accepted: Orders

## 2021-09-01 NOTE — Progress Notes (Signed)
Routine Prenatal Care Visit  Subjective  Debra Hodge is a 31 y.o. G1P0000 at [redacted]w[redacted]d being seen today for ongoing prenatal care.  She is currently monitored for the following issues for this low-risk pregnancy and has Anxiety and depression and Supervision of normal pregnancy on their problem list.  ----------------------------------------------------------------------------------- Patient reports no complaints.   Contractions: Not present. Vag. Bleeding: None.  Movement: Present. Leaking Fluid denies.  ----------------------------------------------------------------------------------- The following portions of the patient's history were reviewed and updated as appropriate: allergies, current medications, past family history, past medical history, past social history, past surgical history and problem list. Problem list updated.  Objective  Blood pressure 110/70, weight 170 lb (77.1 kg), last menstrual period 01/14/2021. Pregravid weight 127 lb (57.6 kg) Total Weight Gain 43 lb (19.5 kg) Urinalysis: Urine Protein    Urine Glucose    Fetal Status: Fetal Heart Rate (bpm): 143 Fundal Height: 33 cm Movement: Present     General:  Alert, oriented and cooperative. Patient is in no acute distress.  Skin: Skin is warm and dry. No rash noted.   Cardiovascular: Normal heart rate noted  Respiratory: Normal respiratory effort, no problems with respiration noted  Abdomen: Soft, gravid, appropriate for gestational age. Pain/Pressure: Absent     Pelvic:  Cervical exam deferred        Extremities: Normal range of motion.  Edema: None  Mental Status: Normal mood and affect. Normal behavior. Normal judgment and thought content.   Assessment   31 y.o. G1P0000 at [redacted]w[redacted]d by  10/21/2021, by Last Menstrual Period presenting for routine prenatal visit  Plan   pregnancy Problems (from 03/03/21 to present)    Problem Noted Resolved   Supervision of normal pregnancy 03/03/2021 by Mirna Mires, CNM No    Overview Addendum 07/02/2021  9:41 AM by Catalina Antigua, MD     Nursing Staff Provider  Office Location  [facility] Dating   CS LMP dating. EDD 10/21/2021  Language  English Anatomy US  normal  Flu Vaccine   Genetic Screen  NIPS: maternT neg, xx  TDaP vaccine    Hgb A1C or  GTT Early : Third trimester : 1 hr 128  Covid    LAB RESULTS   Rhogam   Blood Type O/Positive/-- (02/07 1108)   Feeding Plan  Antibody Negative (02/07 1108)  Contraception  Rubella 2.22 (02/07 1108)  Circumcision  RPR Non Reactive (02/07 1108)   Pediatrician   HBsAg Negative (02/07 1108)   Support Person  HIV Non Reactive (02/07 1108)  Prenatal Classes  Varicella     GBS  (For PCN allergy, check sensitivities)   BTL Consent     VBAC Consent  Pap      Hgb Electro      CF      SMA                   Preterm labor symptoms and general obstetric precautions including but not limited to vaginal bleeding, contractions, leaking of fluid and fetal movement were reviewed in detail with the patient. Please refer to After Visit Summary for other counseling recommendations.   Return in about 2 weeks (around 09/15/2021) for rob.  Tresea Mall, CNM 09/01/2021 8:35 AM

## 2021-09-01 NOTE — Patient Instructions (Signed)
Pain Relief During Labor and Delivery Many things can cause pain during labor and delivery, including: Pressure due to the baby moving through the pelvis. Stretching of tissues due to the baby moving through the birth canal. Muscle tension due to anxiety or nervousness. The uterus tightening (contracting)and relaxing to help move the baby. How do I get pain relief during labor and delivery?  Discuss your pain relief options with your health care provider during your prenatal visits. Explore the options offered by your hospital or birth center. There are many ways to deal with the pain of labor and delivery. You can try relaxation techniques or doing relaxing activities, taking a warm shower or bath (hydrotherapy), or other methods. There are also many medicines available to help control pain. Relaxation techniques and activities Practice relaxation techniques or do relaxing activities, such as: Focused breathing. Meditation. Visualization. Aroma therapy. Listening to your favorite music. Hypnosis. Hydrotherapy Take a warm shower or bath. This may: Provide comfort and relaxation. Lessen your feeling of pain. Reduce the amount of pain medicine needed. Shorten the length of labor. Other methods Try doing other things, such as: Getting a massage or having counterpressure on your back. Applying warm packs or ice packs. Changing positions often, moving around, or using a birthing ball. Medicines You may be given: Pain medicine through an IV or an injection into a muscle. Pain medicine inserted into your spinal column. Injections of sterile water just under the skin on your lower back. Nitrous oxide inhalation therapy, also called laughing gas. What kinds of medicine are available for pain relief? There are two kinds of medicines that can be used to relieve pain during labor and delivery: Analgesics. These medicines decrease pain without causing you to lose feeling or the ability to move  your muscles. Anesthetics. These medicines block feeling in the body and can decrease your ability to move freely. Both kinds of medicine can cause minor side effects, such as nausea, trouble concentrating, and sleepiness. They can also affect the baby's heart rate before birth and his or her breathing after birth. For this reason, health care providers are careful about when and how much medicine is given. Which medicines are used to provide pain relief? Common medicines The most common medicines used to help manage pain during labor and delivery include: Opioids. Opioids are medicines that decrease how much pain you feel (perception of pain). These medicines can be given through an IV or may be used with anesthetics to block pain. Epidural analgesia. Epidural analgesia is given through a very thin tube that is inserted into the lower back. Medicine is delivered continuously to the area near your spinal column nerves (epidural space). After having this treatment, you may be able to move your legs, but you will not be able to walk. Depending on the amount and type of medicine given, you may lose all feeling in the lower half of your body, or you may have some sensation, including the urge to push. This treatment can be used to give pain relief for a vaginal birth. Sometimes, a numbing medicine is injected into the spinal fluid when an epidural catheter is placed. This provides for immediate relief but only lasts for 1-2 hours. Once it wears off, the epidural will provide pain relief. This is called a combined spinal-epidural (CSE) block. Intrathecal analgesia (spinal analgesia). Intrathecal analgesia is similar to epidural analgesia, but the medicine is injected into the spinal fluid instead of the epidural space. It is usually only given once.   It starts to relieve pain quickly, but the pain relief lasts only 1-2 hours. Pudendal block. This block is done by injecting numbing medicine through the wall of  the vagina and into a nerve in the pelvis. Other medicines Other medicines used to help manage pain during labor and delivery include: Local anesthetics. These are used to numb a small area of the body. They may be used along with another kind of medicine or used to numb the nerves of the vagina, cervix, and perineum during the second stage of labor. Spinal block (spinal anesthesia). Spinal anesthesia is similar to spinal analgesia, but the medicine that is used contains longer-acting numbing medicines and pain medicines. This type of anesthesia can be used for a cesarean delivery and allows you to stay awake for the birth of your baby. General anesthetics cause you to lose consciousness so you do not feel pain. They are usually only used for an emergency cesarean delivery. These medicines are given through an IV or a mask or both. These medicines are used as part of a procedure or for an emergency delivery. Summary Women have many options to help them manage the pain associated with labor and delivery. You can try doing relaxing activities, taking a warm shower or bath, or other methods. There are also many medicines available to help control pain during labor and delivery. Talk with your health care provider about what options are available to you. This information is not intended to replace advice given to you by your health care provider. Make sure you discuss any questions you have with your health care provider. Document Revised: 12/27/2018 Document Reviewed: 12/27/2018 Elsevier Patient Education  2023 [employer] Inc[employer]  

## 2021-09-03 ENCOUNTER — Encounter: Payer: Self-pay | Admitting: Obstetrics

## 2021-09-16 ENCOUNTER — Ambulatory Visit (INDEPENDENT_AMBULATORY_CARE_PROVIDER_SITE_OTHER): Payer: Medicaid Other | Admitting: Advanced Practice Midwife

## 2021-09-16 ENCOUNTER — Encounter: Payer: Self-pay | Admitting: Advanced Practice Midwife

## 2021-09-16 VITALS — BP 120/80 | Wt 173.0 lb

## 2021-09-16 DIAGNOSIS — Z3A35 35 weeks gestation of pregnancy: Secondary | ICD-10-CM

## 2021-09-16 DIAGNOSIS — Z3403 Encounter for supervision of normal first pregnancy, third trimester: Secondary | ICD-10-CM

## 2021-09-16 LAB — POCT URINALYSIS DIPSTICK OB
Glucose, UA: NEGATIVE
POC,PROTEIN,UA: NEGATIVE

## 2021-09-16 NOTE — Progress Notes (Signed)
Routine Prenatal Care Visit  Subjective  Debra Hodge is a 31 y.o. G1P0000 at [redacted]w[redacted]d being seen today for ongoing prenatal care.  She is currently monitored for the following issues for this low-risk pregnancy and has Anxiety and depression and Supervision of normal pregnancy on their problem list.  ----------------------------------------------------------------------------------- Patient reports heartburn.  Comfort measures and safe meds reviewed. She has some numbness in her hands. She is taking infant CPR and breastfeeding classes in the next couple weeks.  Contractions: Not present. Vag. Bleeding: None.  Movement: Present. Leaking Fluid denies.  ----------------------------------------------------------------------------------- The following portions of the patient's history were reviewed and updated as appropriate: allergies, current medications, past family history, past medical history, past social history, past surgical history and problem list. Problem list updated.  Objective  Blood pressure 120/80, weight 173 lb (78.5 kg), last menstrual period 01/14/2021. Pregravid weight 127 lb (57.6 kg) Total Weight Gain 46 lb (20.9 kg) Urinalysis: Urine Protein    Urine Glucose    Fetal Status: Fetal Heart Rate (bpm): 148 Fundal Height: 36 cm Movement: Present     General:  Alert, oriented and cooperative. Patient is in no acute distress.  Skin: Skin is warm and dry. No rash noted.   Cardiovascular: Normal heart rate noted  Respiratory: Normal respiratory effort, no problems with respiration noted  Abdomen: Soft, gravid, appropriate for gestational age. Pain/Pressure: Absent     Pelvic:  Cervical exam deferred        Extremities: Normal range of motion.  Edema: None  Mental Status: Normal mood and affect. Normal behavior. Normal judgment and thought content.   Assessment   31 y.o. G1P0000 at [redacted]w[redacted]d by  10/21/2021, by Last Menstrual Period presenting for routine prenatal visit  Plan    pregnancy Problems (from 03/03/21 to present)    Problem Noted Resolved   Supervision of normal pregnancy 03/03/2021 by Mirna Mires, CNM No   Overview Addendum 07/02/2021  9:41 AM by Catalina Antigua, MD     Nursing Staff Provider  Office Location  [facility] Dating   CS LMP dating. EDD 10/21/2021  Language  English Anatomy US  normal  Flu Vaccine   Genetic Screen  NIPS: maternT neg, xx  TDaP vaccine    Hgb A1C or  GTT Early : Third trimester : 1 hr 128  Covid    LAB RESULTS   Rhogam   Blood Type O/Positive/-- (02/07 1108)   Feeding Plan  Antibody Negative (02/07 1108)  Contraception  Rubella 2.22 (02/07 1108)  Circumcision  RPR Non Reactive (02/07 1108)   Pediatrician   HBsAg Negative (02/07 1108)   Support Person  HIV Non Reactive (02/07 1108)  Prenatal Classes  Varicella     GBS  (For PCN allergy, check sensitivities)   BTL Consent     VBAC Consent  Pap      Hgb Electro      CF      SMA                   Preterm labor symptoms and general obstetric precautions including but not limited to vaginal bleeding, contractions, leaking of fluid and fetal movement were reviewed in detail with the patient. Please refer to After Visit Summary for other counseling recommendations.   Return in about 1 week (around 09/23/2021) for rob with GBS.  Tresea Mall, CNM 09/16/2021 9:12 AM

## 2021-09-22 ENCOUNTER — Other Ambulatory Visit (HOSPITAL_COMMUNITY)
Admission: RE | Admit: 2021-09-22 | Discharge: 2021-09-22 | Disposition: A | Discharge status: Home / Self Care | Payer: Medicaid Other | Source: Ambulatory Visit | Attending: Advanced Practice Midwife | Admitting: Advanced Practice Midwife

## 2021-09-22 ENCOUNTER — Encounter: Payer: Self-pay | Admitting: Advanced Practice Midwife

## 2021-09-22 ENCOUNTER — Ambulatory Visit (INDEPENDENT_AMBULATORY_CARE_PROVIDER_SITE_OTHER): Payer: Medicaid Other | Admitting: Advanced Practice Midwife

## 2021-09-22 VITALS — BP 130/80 | Wt 173.0 lb

## 2021-09-22 DIAGNOSIS — Z3A35 35 weeks gestation of pregnancy: Secondary | ICD-10-CM

## 2021-09-22 DIAGNOSIS — Z369 Encounter for antenatal screening, unspecified: Secondary | ICD-10-CM

## 2021-09-22 DIAGNOSIS — Z3403 Encounter for supervision of normal first pregnancy, third trimester: Secondary | ICD-10-CM | POA: Diagnosis not present

## 2021-09-22 DIAGNOSIS — Z3685 Encounter for antenatal screening for Streptococcus B: Secondary | ICD-10-CM

## 2021-09-22 DIAGNOSIS — Z113 Encounter for screening for infections with a predominantly sexual mode of transmission: Secondary | ICD-10-CM | POA: Insufficient documentation

## 2021-09-22 LAB — POCT URINALYSIS DIPSTICK OB
Glucose, UA: NEGATIVE
POC,PROTEIN,UA: NEGATIVE

## 2021-09-22 NOTE — Addendum Note (Signed)
Addended by: Cornelius Moras D on: 09/22/2021 09:38 AM   Modules accepted: Orders

## 2021-09-22 NOTE — Progress Notes (Signed)
Routine Prenatal Care Visit  Subjective  Debra Hodge is a 31 y.o. G1P0000 at [redacted]w[redacted]d being seen today for ongoing prenatal care.  She is currently monitored for the following issues for this low-risk pregnancy and has Anxiety and depression and Supervision of normal pregnancy on their problem list.  ----------------------------------------------------------------------------------- Patient reports no complaints.   Contractions: Not present. Vag. Bleeding: None.  Movement: Present. Leaking Fluid denies.  ----------------------------------------------------------------------------------- The following portions of the patient's history were reviewed and updated as appropriate: allergies, current medications, past family history, past medical history, past social history, past surgical history and problem list. Problem list updated.  Objective  Blood pressure 130/80, weight 173 lb (78.5 kg), last menstrual period 01/14/2021. Pregravid weight 127 lb (57.6 kg) Total Weight Gain 46 lb (20.9 kg) Urinalysis: Urine Protein    Urine Glucose    Fetal Status: Fetal Heart Rate (bpm): 144 Fundal Height: 36 cm Movement: Present     General:  Alert, oriented and cooperative. Patient is in no acute distress.  Skin: Skin is warm and dry. No rash noted.   Cardiovascular: Normal heart rate noted  Respiratory: Normal respiratory effort, no problems with respiration noted  Abdomen: Soft, gravid, appropriate for gestational age. Pain/Pressure: Absent     Pelvic:  GBS/aptima collected        Extremities: Normal range of motion.  Edema: None  Mental Status: Normal mood and affect. Normal behavior. Normal judgment and thought content.   Assessment   31 y.o. G1P0000 at [redacted]w[redacted]d by  10/21/2021, by Last Menstrual Period presenting for routine prenatal visit  Plan   pregnancy Problems (from 03/03/21 to present)    Problem Noted Resolved   Supervision of normal pregnancy 03/03/2021 by Mirna Mires, CNM No    Overview Addendum 07/02/2021  9:41 AM by Catalina Antigua, MD     Nursing Staff Provider  Office Location  [facility] Dating   CS LMP dating. EDD 10/21/2021  Language  English Anatomy US  normal  Flu Vaccine   Genetic Screen  NIPS: maternT neg, xx  TDaP vaccine    Hgb A1C or  GTT Early : Third trimester : 1 hr 128  Covid    LAB RESULTS   Rhogam   Blood Type O/Positive/-- (02/07 1108)   Feeding Plan  Antibody Negative (02/07 1108)  Contraception  Rubella 2.22 (02/07 1108)  Circumcision  RPR Non Reactive (02/07 1108)   Pediatrician   HBsAg Negative (02/07 1108)   Support Person  HIV Non Reactive (02/07 1108)  Prenatal Classes  Varicella     GBS  (For PCN allergy, check sensitivities)   BTL Consent     VBAC Consent  Pap      Hgb Electro      CF      SMA                   Preterm labor symptoms and general obstetric precautions including but not limited to vaginal bleeding, contractions, leaking of fluid and fetal movement were reviewed in detail with the patient. Please refer to After Visit Summary for other counseling recommendations.   Return in about 2 weeks (around 10/06/2021) for rob.  Tresea Mall, CNM 09/22/2021 9:33 AM

## 2021-09-23 LAB — CERVICOVAGINAL ANCILLARY ONLY
Chlamydia: NEGATIVE
Comment: NEGATIVE
Comment: NEGATIVE
Comment: NORMAL
Neisseria Gonorrhea: NEGATIVE
Trichomonas: NEGATIVE

## 2021-09-24 LAB — STREP GP B NAA: Strep Gp B NAA: NEGATIVE

## 2021-09-30 ENCOUNTER — Encounter: Payer: Self-pay | Admitting: Licensed Practical Nurse

## 2021-09-30 ENCOUNTER — Ambulatory Visit (INDEPENDENT_AMBULATORY_CARE_PROVIDER_SITE_OTHER): Payer: Medicaid Other | Admitting: Licensed Practical Nurse

## 2021-09-30 VITALS — BP 126/80 | Wt 176.0 lb

## 2021-09-30 DIAGNOSIS — Z3A37 37 weeks gestation of pregnancy: Secondary | ICD-10-CM

## 2021-09-30 DIAGNOSIS — Z3400 Encounter for supervision of normal first pregnancy, unspecified trimester: Secondary | ICD-10-CM

## 2021-09-30 NOTE — Progress Notes (Signed)
Routine Prenatal Care Visit  Subjective  Debra Hodge is a 31 y.o. G1P0000 at [redacted]w[redacted]d being seen today for ongoing prenatal care.  She is currently monitored for the following issues for this low-risk pregnancy and has Anxiety and depression and Supervision of normal pregnancy on their problem list.  ----------------------------------------------------------------------------------- Patient reports no complaints.  Doing well.  Mood has been good.  Started to see a therapist. Feels ready for baby.  Contractions: Not present. Vag. Bleeding: None.  Movement: Present. Leaking Fluid denies.  ----------------------------------------------------------------------------------- The following portions of the patient's history were reviewed and updated as appropriate: allergies, current medications, past family history, past medical history, past social history, past surgical history and problem list. Problem list updated.  Objective  Blood pressure 126/80, weight 176 lb (79.8 kg), last menstrual period 01/14/2021. Pregravid weight 127 lb (57.6 kg) Total Weight Gain 49 lb (22.2 kg) Urinalysis: Urine Protein    Urine Glucose    Fetal Status: Fetal Heart Rate (bpm): 153 Fundal Height: 38 cm Movement: Present     General:  Alert, oriented and cooperative. Patient is in no acute distress.  Skin: Skin is warm and dry. No rash noted.   Cardiovascular: Normal heart rate noted  Respiratory: Normal respiratory effort, no problems with respiration noted  Abdomen: Soft, gravid, appropriate for gestational age. Pain/Pressure: Absent     Pelvic:  Cervical exam deferred        Extremities: Normal range of motion.  Edema: Trace  Mental Status: Normal mood and affect. Normal behavior. Normal judgment and thought content.   Assessment   31 y.o. G1P0000 at [redacted]w[redacted]d by  10/21/2021, by Last Menstrual Period presenting for routine prenatal visit  Plan   pregnancy Problems (from 03/03/21 to present)     Problem Noted  Resolved   Supervision of normal pregnancy 03/03/2021 by Mirna Mires, CNM No   Overview Addendum 07/02/2021  9:41 AM by Catalina Antigua, MD     Nursing Staff Provider  Office Location  [facility] Dating   CS LMP dating. EDD 10/21/2021  Language  English Anatomy US  normal  Flu Vaccine   Genetic Screen  NIPS: maternT neg, xx  TDaP vaccine    Hgb A1C or  GTT Early : Third trimester : 1 hr 128  Covid    LAB RESULTS   Rhogam   Blood Type O/Positive/-- (02/07 1108)   Feeding Plan  Antibody Negative (02/07 1108)  Contraception  Rubella 2.22 (02/07 1108)  Circumcision  RPR Non Reactive (02/07 1108)   Pediatrician   HBsAg Negative (02/07 1108)   Support Person  HIV Non Reactive (02/07 1108)  Prenatal Classes  Varicella     GBS  (For PCN allergy, check sensitivities)   BTL Consent     VBAC Consent  Pap      Hgb Electro      CF      SMA                    Term labor symptoms and general obstetric precautions including but not limited to vaginal bleeding, contractions, leaking of fluid and fetal movement were reviewed in detail with the patient. Please refer to After Visit Summary for other counseling recommendations.   Return in about 1 week (around 10/07/2021) for ROB.  Carie Caddy, CNM  Domingo Pulse, [facility]  09/30/21  9:23 AM

## 2021-09-30 NOTE — Progress Notes (Signed)
No vb. No lof.  

## 2021-10-07 ENCOUNTER — Ambulatory Visit (INDEPENDENT_AMBULATORY_CARE_PROVIDER_SITE_OTHER): Payer: Medicaid Other | Admitting: Obstetrics & Gynecology

## 2021-10-07 ENCOUNTER — Encounter: Payer: Self-pay | Admitting: Obstetrics & Gynecology

## 2021-10-07 VITALS — BP 120/70 | Wt 177.0 lb

## 2021-10-07 DIAGNOSIS — Z3A38 38 weeks gestation of pregnancy: Secondary | ICD-10-CM

## 2021-10-07 LAB — POCT URINALYSIS DIPSTICK OB
Glucose, UA: NEGATIVE
POC,PROTEIN,UA: NEGATIVE

## 2021-10-14 ENCOUNTER — Ambulatory Visit (INDEPENDENT_AMBULATORY_CARE_PROVIDER_SITE_OTHER): Payer: Medicaid Other | Admitting: Obstetrics

## 2021-10-14 VITALS — BP 122/70 | Wt 177.0 lb

## 2021-10-14 DIAGNOSIS — Z3403 Encounter for supervision of normal first pregnancy, third trimester: Secondary | ICD-10-CM

## 2021-10-14 DIAGNOSIS — Z3A39 39 weeks gestation of pregnancy: Secondary | ICD-10-CM

## 2021-10-14 NOTE — Progress Notes (Signed)
No lof. No vb.

## 2021-10-14 NOTE — Progress Notes (Signed)
Routine Prenatal Care Visit  Subjective  Debra Hodge is a 31 y.o. G1P0000 at [redacted]w[redacted]d being seen today for ongoing prenatal care.  She is currently monitored for the following issues for this low-risk pregnancy and has Anxiety and depression and Supervision of normal pregnancy on their problem list.  ----------------------------------------------------------------------------------- Patient reports no complaints.  She declines a vaginal exam today. Would agree to IOL once post EDD. Contractions: Not present. Vag. Bleeding: None.  Movement: Present. Leaking Fluid denies.  ----------------------------------------------------------------------------------- The following portions of the patient's history were reviewed and updated as appropriate: allergies, current medications, past family history, past medical history, past social history, past surgical history and problem list. Problem list updated.  Objective  Blood pressure 122/70, weight 177 lb (80.3 kg), last menstrual period 01/14/2021. Pregravid weight 127 lb (57.6 kg) Total Weight Gain 50 lb (22.7 kg) Urinalysis: Urine Protein    Urine Glucose    Fetal Status:     Movement: Present     General:  Alert, oriented and cooperative. Patient is in no acute distress.  Skin: Skin is warm and dry. No rash noted.   Cardiovascular: Normal heart rate noted  Respiratory: Normal respiratory effort, no problems with respiration noted  Abdomen: Soft, gravid, appropriate for gestational age. Pain/Pressure: Present     Pelvic:  Cervical exam deferred        Extremities: Normal range of motion.     Mental Status: Normal mood and affect. Normal behavior. Normal judgment and thought content.   Assessment   31 y.o. G1P0000 at [redacted]w[redacted]d by  10/21/2021, by Last Menstrual Period presenting for routine prenatal visit  Plan   pregnancy Problems (from 03/03/21 to present)    Problem Noted Resolved   Supervision of normal pregnancy 03/03/2021 by Mirna Mires, CNM No   Overview Addendum 09/30/2021  9:23 AM by Ellwood Sayers, CNM     Nursing Staff Provider  Office Location  [facility] Dating   CS LMP dating. EDD 10/21/2021  Language  English Anatomy US  normal  Flu Vaccine   Genetic Screen  NIPS: maternT neg, xx  TDaP vaccine   09/01/2021 Hgb A1C or  GTT Early : Third trimester : 1 hr 128  Covid    LAB RESULTS   Rhogam  NA Blood Type O/Positive/-- (02/07 1108)   Feeding Plan Breast Antibody Negative (02/07 1108)  Contraception POP Rubella 2.22 (02/07 1108)  Circumcision NA RPR Non Reactive (02/07 1108)   Pediatrician  [place]  HBsAg Negative (02/07 1108)   Support Person  HIV Non Reactive (02/07 1108)  Prenatal Classes  Varicella Non immune     GBS Negative/-- (08/01 0929)(For PCN allergy, check sensitivities)   BTL Consent     VBAC Consent  Pap 07/02/2020 NILM, neg HPV     Hgb Electro      CF      SMA                   Term labor symptoms and general obstetric precautions including but not limited to vaginal bleeding, contractions, leaking of fluid and fetal movement were reviewed in detail with the patient. Please refer to After Visit Summary for other counseling recommendations.  Time spent reviewing stages of labor and how to know when to go to the unit for a labor check.  No follow-ups on file.  Mirna Mires, CNM  10/14/2021 10:13 AM

## 2021-10-20 ENCOUNTER — Ambulatory Visit (INDEPENDENT_AMBULATORY_CARE_PROVIDER_SITE_OTHER): Payer: Medicaid Other | Admitting: Licensed Practical Nurse

## 2021-10-20 VITALS — BP 120/82 | Wt 177.0 lb

## 2021-10-20 DIAGNOSIS — Z3403 Encounter for supervision of normal first pregnancy, third trimester: Secondary | ICD-10-CM

## 2021-10-20 DIAGNOSIS — Z3A39 39 weeks gestation of pregnancy: Secondary | ICD-10-CM

## 2021-10-20 LAB — POCT URINALYSIS DIPSTICK OB
Glucose, UA: NEGATIVE
POC,PROTEIN,UA: NEGATIVE

## 2021-10-20 NOTE — Progress Notes (Signed)
Routine Prenatal Care Visit  Subjective  Debra Hodge is a 31 y.o. G1P0000 at [redacted]w[redacted]d being seen today for ongoing prenatal care.  She is currently monitored for the following issues for this low-risk pregnancy and has Anxiety and depression and Supervision of normal pregnancy on their problem list.  ----------------------------------------------------------------------------------- Patient reports no complaints.  Doing well.  Feeling a little B-H contractions/cramping. Feels ready for baby.  Desires IOL at 41 wks.  Anticipatory guidance given.  Contractions: Irritability. Vag. Bleeding: None.  Movement: Present. Leaking Fluid denies.  ----------------------------------------------------------------------------------- The following portions of the patient's history were reviewed and updated as appropriate: allergies, current medications, past family history, past medical history, past social history, past surgical history and problem list. Problem list updated.  Objective  Blood pressure 120/82, weight 177 lb (80.3 kg), last menstrual period 01/14/2021. Pregravid weight 127 lb (57.6 kg) Total Weight Gain 50 lb (22.7 kg) Urinalysis: Urine Protein Negative  Urine Glucose Negative  Fetal Status: Fetal Heart Rate (bpm): 140 Fundal Height: 40 cm Movement: Present  Presentation: Vertex  General:  Alert, oriented and cooperative. Patient is in no acute distress.  Skin: Skin is warm and dry. No rash noted.   Cardiovascular: Normal heart rate noted  Respiratory: Normal respiratory effort, no problems with respiration noted  Abdomen: Soft, gravid, appropriate for gestational age. Pain/Pressure: Present     Pelvic:  Cervical exam performed Dilation: Fingertip Effacement (%): Thick Station: -2  Extremities: Normal range of motion.  Edema: Trace  Mental Status: Normal mood and affect. Normal behavior. Normal judgment and thought content.   Assessment   31 y.o. G1P0000 at [redacted]w[redacted]d by  10/21/2021, by Last  Menstrual Period presenting for routine prenatal visit  Plan   pregnancy Problems (from 03/03/21 to present)     Problem Noted Resolved   Supervision of normal pregnancy 03/03/2021 by Mirna Mires, CNM No   Overview Addendum 09/30/2021  9:23 AM by Ellwood Sayers, CNM     Nursing Staff Provider  Office Location  [facility] Dating   CS LMP dating. EDD 10/21/2021  Language  English Anatomy US  normal  Flu Vaccine   Genetic Screen  NIPS: maternT neg, xx  TDaP vaccine   09/01/2021 Hgb A1C or  GTT Early : Third trimester : 1 hr 128  Covid    LAB RESULTS   Rhogam  NA Blood Type O/Positive/-- (02/07 1108)   Feeding Plan Breast Antibody Negative (02/07 1108)  Contraception POP Rubella 2.22 (02/07 1108)  Circumcision NA RPR Non Reactive (02/07 1108)   Pediatrician  [place]  HBsAg Negative (02/07 1108)   Support Person  HIV Non Reactive (02/07 1108)  Prenatal Classes  Varicella Non immune     GBS Negative/-- (08/01 0929)(For PCN allergy, check sensitivities)   BTL Consent     VBAC Consent  Pap 07/02/2020 NILM, neg HPV     Hgb Electro      CF      SMA                    Term labor symptoms and general obstetric precautions including but not limited to vaginal bleeding, contractions, leaking of fluid and fetal movement were reviewed in detail with the patient. Please refer to After Visit Summary for other counseling recommendations.   [place] for AFI ordered, pt will need NST/AFI/ROB in 1 week.   IOL 9/6 at 0800  Carie Caddy, CNM  Domingo Pulse, [facility]  10/20/21  9:53 AM

## 2021-10-21 ENCOUNTER — Inpatient Hospital Stay: Admit: 2021-10-21 | Payer: Self-pay

## 2021-10-23 ENCOUNTER — Ambulatory Visit
Admission: RE | Admit: 2021-10-23 | Discharge: 2021-10-23 | Disposition: A | Discharge status: Home / Self Care | Payer: Medicaid Other | Source: Ambulatory Visit | Attending: Licensed Practical Nurse | Admitting: Licensed Practical Nurse

## 2021-10-23 DIAGNOSIS — Z3403 Encounter for supervision of normal first pregnancy, third trimester: Secondary | ICD-10-CM

## 2021-10-23 DIAGNOSIS — O480 Post-term pregnancy: Secondary | ICD-10-CM | POA: Diagnosis not present

## 2021-10-23 DIAGNOSIS — Z3A39 39 weeks gestation of pregnancy: Secondary | ICD-10-CM | POA: Diagnosis not present

## 2021-10-27 ENCOUNTER — Encounter: Payer: Self-pay | Admitting: Licensed Practical Nurse

## 2021-10-28 ENCOUNTER — Inpatient Hospital Stay: Payer: Medicaid Other | Admitting: General Practice

## 2021-10-28 ENCOUNTER — Inpatient Hospital Stay
Admission: RE | Admit: 2021-10-28 | Discharge: 2021-10-30 | DRG: 807 | Disposition: A | Discharge status: Home / Self Care | Payer: Medicaid Other | Attending: Obstetrics | Admitting: Obstetrics

## 2021-10-28 ENCOUNTER — Other Ambulatory Visit: Payer: Self-pay

## 2021-10-28 ENCOUNTER — Encounter: Payer: Self-pay | Admitting: Obstetrics and Gynecology

## 2021-10-28 DIAGNOSIS — Z3403 Encounter for supervision of normal first pregnancy, third trimester: Secondary | ICD-10-CM

## 2021-10-28 DIAGNOSIS — O480 Post-term pregnancy: Secondary | ICD-10-CM | POA: Diagnosis not present

## 2021-10-28 DIAGNOSIS — Z23 Encounter for immunization: Secondary | ICD-10-CM | POA: Diagnosis not present

## 2021-10-28 DIAGNOSIS — Z3A41 41 weeks gestation of pregnancy: Secondary | ICD-10-CM

## 2021-10-28 DIAGNOSIS — O326XX0 Maternal care for compound presentation, not applicable or unspecified: Secondary | ICD-10-CM | POA: Diagnosis present

## 2021-10-28 DIAGNOSIS — Z3490 Encounter for supervision of normal pregnancy, unspecified, unspecified trimester: Secondary | ICD-10-CM | POA: Diagnosis present

## 2021-10-28 LAB — TYPE AND SCREEN
ABO/RH(D): O POS
Antibody Screen: NEGATIVE

## 2021-10-28 LAB — CBC
HCT: 37.6 % (ref 36.0–46.0)
Hemoglobin: 13.3 g/dL (ref 12.0–15.0)
MCH: 30.8 pg (ref 26.0–34.0)
MCHC: 35.4 g/dL (ref 30.0–36.0)
MCV: 87 fL (ref 80.0–100.0)
Platelets: 249 10*3/uL (ref 150–400)
RBC: 4.32 MIL/uL (ref 3.87–5.11)
RDW: 13.7 % (ref 11.5–15.5)
WBC: 11.5 10*3/uL — ABNORMAL HIGH (ref 4.0–10.5)
nRBC: 0 % (ref 0.0–0.2)

## 2021-10-28 LAB — ABO/RH: ABO/RH(D): O POS

## 2021-10-28 MED ORDER — LIDOCAINE HCL (PF) 1 % IJ SOLN
30.0000 mL | INTRAMUSCULAR | Status: DC | PRN
  Filled 2021-10-28: qty 30

## 2021-10-28 MED ORDER — BUPIVACAINE HCL (PF) 0.25 % IJ SOLN
INTRAMUSCULAR | Status: DC | PRN
  Administered 2021-10-28 (×2): 4 mL via EPIDURAL

## 2021-10-28 MED ORDER — LIDOCAINE HCL (PF) 1 % IJ SOLN
INTRAMUSCULAR | Status: DC | PRN
  Administered 2021-10-28: 3 mL via SUBCUTANEOUS

## 2021-10-28 MED ORDER — FENTANYL CITRATE (PF) 100 MCG/2ML IJ SOLN: 50.0000 ug | INTRAMUSCULAR | Status: DC | PRN

## 2021-10-28 MED ORDER — OXYCODONE HCL 5 MG PO TABS: 10.0000 mg | ORAL_TABLET | ORAL | Status: DC | PRN

## 2021-10-28 MED ORDER — DOCUSATE SODIUM 100 MG PO CAPS
100.0000 mg | ORAL_CAPSULE | Freq: Two times a day (BID) | ORAL | Status: DC
  Administered 2021-10-29 – 2021-10-30 (×3): 100 mg via ORAL
  Filled 2021-10-28 (×3): qty 1

## 2021-10-28 MED ORDER — OXYCODONE HCL 5 MG PO TABS: 5.0000 mg | ORAL_TABLET | ORAL | Status: DC | PRN

## 2021-10-28 MED ORDER — EPHEDRINE 5 MG/ML INJ: 10.0000 mg | INTRAVENOUS | Status: DC | PRN

## 2021-10-28 MED ORDER — SOD CITRATE-CITRIC ACID 500-334 MG/5ML PO SOLN: 30.0000 mL | ORAL | Status: DC | PRN

## 2021-10-28 MED ORDER — OXYTOCIN 10 UNIT/ML IJ SOLN
INTRAMUSCULAR | Status: AC
  Filled 2021-10-28: qty 2

## 2021-10-28 MED ORDER — ACETAMINOPHEN 325 MG PO TABS
650.0000 mg | ORAL_TABLET | ORAL | Status: DC | PRN
  Administered 2021-10-29 (×3): 650 mg via ORAL
  Filled 2021-10-28 (×3): qty 2

## 2021-10-28 MED ORDER — OXYTOCIN-SODIUM CHLORIDE 30-0.9 UT/500ML-% IV SOLN
1.0000 m[IU]/min | INTRAVENOUS | Status: DC
  Administered 2021-10-28: 2 m[IU]/min via INTRAVENOUS
  Filled 2021-10-28: qty 500

## 2021-10-28 MED ORDER — SIMETHICONE 80 MG PO CHEW: 80.0000 mg | CHEWABLE_TABLET | ORAL | Status: DC | PRN

## 2021-10-28 MED ORDER — DIPHENHYDRAMINE HCL 25 MG PO CAPS: 25.0000 mg | ORAL_CAPSULE | Freq: Four times a day (QID) | ORAL | Status: DC | PRN

## 2021-10-28 MED ORDER — LACTATED RINGERS IV SOLN: 500.0000 mL | INTRAVENOUS | Status: DC | PRN

## 2021-10-28 MED ORDER — OXYTOCIN BOLUS FROM INFUSION
333.0000 mL | Freq: Once | INTRAVENOUS | Status: AC
  Administered 2021-10-28: 333 mL via INTRAVENOUS

## 2021-10-28 MED ORDER — FENTANYL-BUPIVACAINE-NACL 0.5-0.125-0.9 MG/250ML-% EP SOLN
12.0000 mL/h | EPIDURAL | Status: DC | PRN
  Administered 2021-10-28: 12 mL/h via EPIDURAL
  Filled 2021-10-28: qty 250

## 2021-10-28 MED ORDER — WITCH HAZEL-GLYCERIN EX PADS
1.0000 | MEDICATED_PAD | CUTANEOUS | Status: DC | PRN
  Filled 2021-10-28: qty 100

## 2021-10-28 MED ORDER — IBUPROFEN 600 MG PO TABS
600.0000 mg | ORAL_TABLET | Freq: Four times a day (QID) | ORAL | Status: DC
  Administered 2021-10-29 – 2021-10-30 (×5): 600 mg via ORAL
  Filled 2021-10-28 (×5): qty 1

## 2021-10-28 MED ORDER — LIDOCAINE-EPINEPHRINE (PF) 1.5 %-1:200000 IJ SOLN
INTRAMUSCULAR | Status: DC | PRN
  Administered 2021-10-28: 3 mL via EPIDURAL

## 2021-10-28 MED ORDER — TERBUTALINE SULFATE 1 MG/ML IJ SOLN: 0.2500 mg | Freq: Once | INTRAMUSCULAR | Status: DC | PRN

## 2021-10-28 MED ORDER — DIPHENHYDRAMINE HCL 50 MG/ML IJ SOLN: 12.5000 mg | INTRAMUSCULAR | Status: DC | PRN

## 2021-10-28 MED ORDER — BENZOCAINE-MENTHOL 20-0.5 % EX AERO
1.0000 | INHALATION_SPRAY | CUTANEOUS | Status: DC | PRN
  Administered 2021-10-29: 1 via TOPICAL
  Filled 2021-10-28 (×2): qty 56

## 2021-10-28 MED ORDER — PHENYLEPHRINE 80 MCG/ML (10ML) SYRINGE FOR IV PUSH (FOR BLOOD PRESSURE SUPPORT)
80.0000 ug | PREFILLED_SYRINGE | INTRAVENOUS | Status: DC | PRN
Start: 2021-10-28 — End: 2021-10-28

## 2021-10-28 MED ORDER — TETANUS-DIPHTH-ACELL PERTUSSIS 5-2.5-18.5 LF-MCG/0.5 IM SUSY: 0.5000 mL | PREFILLED_SYRINGE | Freq: Once | INTRAMUSCULAR | Status: DC

## 2021-10-28 MED ORDER — ONDANSETRON HCL 4 MG/2ML IJ SOLN: 4.0000 mg | INTRAMUSCULAR | Status: DC | PRN

## 2021-10-28 MED ORDER — ONDANSETRON HCL 4 MG PO TABS: 4.0000 mg | ORAL_TABLET | ORAL | Status: DC | PRN

## 2021-10-28 MED ORDER — COCONUT OIL OIL
1.0000 | TOPICAL_OIL | Status: DC | PRN
  Filled 2021-10-28: qty 7.5

## 2021-10-28 MED ORDER — OXYTOCIN-SODIUM CHLORIDE 30-0.9 UT/500ML-% IV SOLN
2.5000 [IU]/h | INTRAVENOUS | Status: DC
  Administered 2021-10-28: 2.5 [IU]/h via INTRAVENOUS

## 2021-10-28 MED ORDER — MISOPROSTOL 25 MCG QUARTER TABLET
25.0000 ug | ORAL_TABLET | Freq: Once | ORAL | Status: AC
  Administered 2021-10-28: 25 ug via ORAL
  Filled 2021-10-28: qty 1

## 2021-10-28 MED ORDER — MISOPROSTOL 25 MCG QUARTER TABLET
25.0000 ug | ORAL_TABLET | Freq: Once | ORAL | Status: AC
  Administered 2021-10-28: 25 ug via VAGINAL
  Filled 2021-10-28: qty 1

## 2021-10-28 MED ORDER — MISOPROSTOL 200 MCG PO TABS
ORAL_TABLET | ORAL | Status: AC
  Filled 2021-10-28: qty 4

## 2021-10-28 MED ORDER — ZOLPIDEM TARTRATE 5 MG PO TABS: 5.0000 mg | ORAL_TABLET | Freq: Every evening | ORAL | Status: DC | PRN

## 2021-10-28 MED ORDER — LACTATED RINGERS IV SOLN
500.0000 mL | Freq: Once | INTRAVENOUS | Status: AC
Start: 2021-10-28 — End: 2021-10-28
  Administered 2021-10-28: 500 mL via INTRAVENOUS

## 2021-10-28 MED ORDER — PHENYLEPHRINE 80 MCG/ML (10ML) SYRINGE FOR IV PUSH (FOR BLOOD PRESSURE SUPPORT): 80.0000 ug | PREFILLED_SYRINGE | INTRAVENOUS | Status: DC | PRN

## 2021-10-28 MED ORDER — LACTATED RINGERS IV SOLN: INTRAVENOUS | Status: DC

## 2021-10-28 MED ORDER — PRENATAL MULTIVITAMIN CH
1.0000 | ORAL_TABLET | Freq: Every day | ORAL | Status: DC
  Administered 2021-10-29: 1 via ORAL
  Filled 2021-10-28: qty 1

## 2021-10-28 MED ORDER — AMMONIA AROMATIC IN INHA
RESPIRATORY_TRACT | Status: AC
  Filled 2021-10-28: qty 10

## 2021-10-28 MED ORDER — IBUPROFEN 600 MG PO TABS
600.0000 mg | ORAL_TABLET | Freq: Four times a day (QID) | ORAL | Status: DC
  Administered 2021-10-28: 600 mg via ORAL
  Filled 2021-10-28: qty 1

## 2021-10-28 MED ORDER — ONDANSETRON HCL 4 MG/2ML IJ SOLN
4.0000 mg | Freq: Four times a day (QID) | INTRAMUSCULAR | Status: DC | PRN
  Administered 2021-10-28: 4 mg via INTRAVENOUS
  Filled 2021-10-28: qty 2

## 2021-10-28 MED ORDER — DIBUCAINE (PERIANAL) 1 % EX OINT
1.0000 | TOPICAL_OINTMENT | CUTANEOUS | Status: DC | PRN
Start: 2021-10-28 — End: 2021-10-30
  Administered 2021-10-29: 1 via RECTAL
  Filled 2021-10-28 (×2): qty 28

## 2021-10-28 NOTE — Discharge Summary (Signed)
Postpartum Discharge Summary  Date of Service updated 10/30/2021     Patient Name: Debra Hodge DOB: 12-10-1990 MRN: [id number]  Date of admission: 10/28/2021 Delivery date:10/28/2021  Delivering provider: Imagene Riches  Date of discharge: 10/30/2021  Admitting diagnosis: Encounter for induction of labor [Z34.90] Intrauterine pregnancy: [redacted]w[redacted]d    Secondary diagnosis:  Principal Problem:   Encounter for induction of labor Active Problems:   Postpartum care following vaginal delivery  Additional problems: none    Discharge diagnosis: Term Pregnancy Delivered                                              Post partum procedures: NA Augmentation: Pitocin and Cytotec Complications: None  Hospital course: Induction of Labor With Vaginal Delivery   31y.o. yo G1P0000 at 445w0das admitted to the hospital 10/28/2021 for induction of labor.  Indication for induction: Postdates.  Patient had an uncomplicated labor course as follows: Membrane Rupture Time/Date: 12:15 PM ,10/28/2021   Delivery Method:Vaginal, Spontaneous  Episiotomy: None  Lacerations:  2nd degree;Perineal  Details of delivery can be found in separate delivery note.  Patient had a routine postpartum course. Ambulating and voiding without difficulty. Modo is good.  Has a lot of support at home. Breastfeeding independently, left nipple with a crack but managing.  Patient is discharged home 10/30/21.  Newborn Data: Birth date:10/28/2021  Birth time:8:22 PM  Gender:Female  "AvBurton ApleyLiving status:Living  Apgars:8 ,9  Weight:3900 g   Magnesium Sulfate received: No BMZ received: No Rhophylac:N/A MMR:No T-DaP:Given prenatally Flu: No Transfusion:No  Physical exam  Vitals:   10/29/21 0339 10/29/21 1600 10/29/21 2337 10/30/21 0723  BP:  118/81 (!) 118/90 121/86  Pulse: (!) 110 (!) 122 98 95  Resp:  '18 18 18  ' Temp:  98.5 F (36.9 C) (!) 97.5 F (36.4 C) 98.8 F (37.1 C)  TempSrc:  Oral Oral Oral  SpO2:  98% 98%  98%  Weight:      Height:       General: alert Breasts: soft no redness or masses, Left nipple a little misshapen, crack present . Right nipple erect and intact.  Lochia: appropriate Uterine Fundus: firm Incision: N/A Perineum: well approximated, slightly swollen.  DVT Evaluation: No evidence of DVT seen on physical exam. Labs: Lab Results  Component Value Date   WBC 15.8 (H) 10/30/2021   HGB 9.4 (L) 10/30/2021   HCT 27.7 (L) 10/30/2021   MCV 90.2 10/30/2021   PLT 219 10/30/2021      Latest Ref Rng & Units 08/04/2015   10:13 AM  CMP  Glucose 65 - 99 mg/dL 74   BUN 7 - 25 mg/dL 10   Creatinine 0.50 - 1.10 mg/dL 0.66   Sodium 135 - 146 mmol/L 139   Potassium 3.5 - 5.3 mmol/L 4.3   Chloride 98 - 110 mmol/L 104   CO2 20 - 31 mmol/L 22   Calcium 8.6 - 10.2 mg/dL 9.4    Edinburgh Score:    10/29/2021    4:52 PM  Edinburgh Postnatal Depression Scale Screening Tool  I have been able to laugh and see the funny side of things. 0  I have looked forward with enjoyment to things. 0  I have blamed myself unnecessarily when things went wrong. 0  I have been anxious or worried for no good reason.  0  I have felt scared or panicky for no good reason. 0  Things have been getting on top of me. 0  I have been so unhappy that I have had difficulty sleeping. 0  I have felt sad or miserable. 0  I have been so unhappy that I have been crying. 0  The thought of harming myself has occurred to me. 0  Edinburgh Postnatal Depression Scale Total 0      After visit meds:  Allergies as of 10/30/2021       Reactions   Shrimp [shellfish Allergy] Other (See Comments)   Headache and vomiting        Medication List     TAKE these medications    ferrous sulfate 325 (65 FE) MG tablet Commonly known as: FerrouSul Take 1 tablet (325 mg total) by mouth 2 (two) times daily.   multivitamin-prenatal 27-0.8 MG Tabs tablet Take 1 tablet by mouth daily at 12 noon.         Discharge home in  stable condition Infant Feeding: Breast Infant Disposition:home with mother Discharge instruction: per After Visit Summary and Postpartum booklet. Activity: Advance as tolerated. Pelvic rest for 6 weeks.  Diet: routine diet Anticipated Birth Control: POPs Postpartum Appointment:6 weeks Additional Postpartum F/U: Postpartum Depression checkup via phone at 2 wks PP Future Appointments:No future appointments. Follow up Visit:  Follow-up Information     Imagene Riches, CNM. Schedule an appointment as soon as possible for a visit in 2 week(s).   Specialties: Obstetrics, Gynecology Why: contact the [place] office and make a telephone appointment for 2 weeks post delivery, and then another appointment for a 6 week post delivery physical and contraception visit. Contact information: [address] [place] [address] [phone]                     10/30/2021 [place], CNM

## 2021-10-28 NOTE — Anesthesia Preprocedure Evaluation (Signed)
Anesthesia Evaluation  Patient identified by MRN, date of birth, ID band Patient awake    Reviewed: Allergy & Precautions, H&P , NPO status , Patient's Chart, lab work & pertinent test results  Airway Mallampati: II  TM Distance: >3 FB     Dental no notable dental hx.    Pulmonary    Pulmonary exam normal        Cardiovascular Normal cardiovascular exam     Neuro/Psych PSYCHIATRIC DISORDERS Anxiety Depression negative neurological ROS     GI/Hepatic Neg liver ROS, GERD  ,  Endo/Other  negative endocrine ROS  Renal/GU negative Renal ROS  negative genitourinary   Musculoskeletal   Abdominal   Peds  Hematology negative hematology ROS (+)   Anesthesia Other Findings   Reproductive/Obstetrics (+) Pregnancy                             Anesthesia Physical Anesthesia Plan  ASA: 2  Anesthesia Plan: Epidural   Post-op Pain Management:    Induction:   PONV Risk Score and Plan:   Airway Management Planned:   Additional Equipment:   Intra-op Plan:   Post-operative Plan:   Informed Consent: I have reviewed the patients History and Physical, chart, labs and discussed the procedure including the risks, benefits and alternatives for the proposed anesthesia with the patient or authorized representative who has indicated his/her understanding and acceptance.     Dental Advisory Given  Plan Discussed with: Anesthesiologist and CRNA  Anesthesia Plan Comments:         Anesthesia Quick Evaluation

## 2021-10-28 NOTE — Anesthesia Procedure Notes (Signed)
Epidural Patient location during procedure: OB Start time: 10/28/2021 1:43 PM End time: 10/28/2021 1:50 PM  Staffing Anesthesiologist: Stephanie Coup, MD Resident/CRNA: Karoline Caldwell, CRNA Performed: resident/CRNA   Preanesthetic Checklist Completed: patient identified, IV checked, site marked, risks and benefits discussed, surgical consent, monitors and equipment checked, pre-op evaluation and timeout performed  Epidural Patient position: sitting Prep: ChloraPrep Patient monitoring: heart rate, continuous pulse ox and blood pressure Approach: midline Location: L3-L4 Injection technique: LOR air  Needle:  Needle type: Tuohy  Needle gauge: 17 G Needle length: 9 cm and 9 Needle insertion depth: 7 cm Catheter type: closed end flexible Catheter size: 19 Gauge Catheter at skin depth: 12 cm Test dose: negative and 1.5% lidocaine with Epi 1:200 K  Assessment Events: blood not aspirated, injection not painful, no injection resistance, no paresthesia and negative IV test  Additional Notes 1 attempt Pt. Evaluated and documentation done after procedure finished. Patient identified. Risks/Benefits/Options discussed with patient including but not limited to bleeding, infection, nerve damage, paralysis, failed block, incomplete pain control, headache, blood pressure changes, nausea, vomiting, reactions to medication both or allergic, itching and postpartum back pain. Confirmed with bedside nurse the patient's most recent platelet count. Confirmed with patient that they are not currently taking any anticoagulation, have any bleeding history or any family history of bleeding disorders. Patient expressed understanding and wished to proceed. All questions were answered. Sterile technique was used throughout the entire procedure. Please see nursing notes for vital signs. Test dose was given through epidural catheter and negative prior to continuing to dose epidural or start infusion. Warning signs of  high block given to the patient including shortness of breath, tingling/numbness in hands, complete motor block, or any concerning symptoms with instructions to call for help. Patient was given instructions on fall risk and not to get out of bed. All questions and concerns addressed with instructions to call with any issues or inadequate analgesia.    Patient tolerated the insertion well without immediate complications.Reason for block:procedure for pain

## 2021-10-28 NOTE — Progress Notes (Signed)
Debra Hodge is a 31 y.o. G1P0000 at [redacted]w[redacted]d by ultrasound admitted for induction of labor due to Post dates. Due date (10/21/2021). She received one initial dose of vaginal and oral Cytotec. Has moved from a fingertip to 3 cms dilated. Recently received an epidural, and is getting more comfortable.  Subjective:  Still grimacing with the peak of each contraction. She also reports leaking of fluid.   Objective: BP (!) 119/90   Pulse 74   Temp 98.4 F (36.9 C) (Oral)   Resp 16   Ht 5\' 3"  (1.6 m)   Wt 80.3 kg   LMP 01/14/2021 (Exact Date)   SpO2 96%   BMI 31.35 kg/m  No intake/output data recorded. Total I/O In: 1838.8 [I.V.:1820.4; Other:18.4] Out: -   FHT:  FHR: 135- bpm, variability: moderate,  accelerations:  Present,  decelerations:  Absent UC:   regular, every 2.5- 5 minutes, some doubling of contractions noted. SVE:   Dilation: 3.5 Effacement (%): 80 Station: -2 Exam by:: [email or web address] CNM + LOF, suspect SROM  Labs: Lab Results  Component Value Date   WBC 11.5 (H) 10/28/2021   HGB 13.3 10/28/2021   HCT 37.6 10/28/2021   MCV 87.0 10/28/2021   PLT 249 10/28/2021    Assessment / Plan: Induction of labor due to postterm,  progressing well on pitocin  Labor: Progressing normally  Fetal Wellbeing:  Category I Pain Control:  Epidural I/D:  n/a Anticipated MOD:  NSVD  Will augment with pitocin if needed.  Now getting comfortable, and will encourage her to rest. Recheck in several hours- augggmentation with IV pitocin if not making change or contraction frequency does not increase.  12/28/2021, CNM  10/28/2021 10:04 PM    12/28/2021, CNM 10/28/2021, 4:13 PM

## 2021-10-28 NOTE — H&P (Signed)
Debra Hodge is a 31 y.o. female presenting for induction of labor at [redacted] weeks gestation. She  has signed consents for IOL, and is aware that the process will involve Cytotec, perhaps a foley ball and /or pitocin.. OB History     Gravida  1   Para  0   Term  0   Preterm  0   AB  0   Living  0      SAB  0   IAB  0   Ectopic  0   Multiple  0   Live Births  0          Past Medical History:  Diagnosis Date   BRCA negative 06/2020   MyRisk neg   Depression    Family history of breast cancer    Family history of uterine cancer    in her mom   Increased risk of breast cancer 06/2020   IBIS=21.1%/riskscore=5.5%   History reviewed. No pertinent surgical history. Family History: family history includes Alcohol abuse in her father and paternal grandmother; Arthritis in her maternal grandfather and maternal grandmother; Breast cancer (age of onset: 37) in her paternal aunt; Cancer in her maternal grandmother; Colon cancer (age of onset: 37) in her maternal uncle; Depression in her father; Diabetes in her maternal grandfather and maternal grandmother; Endometrial cancer (age of onset: 64) in her mother. Social History:  reports that she has never smoked. She has never used smokeless tobacco. She reports current alcohol use. She reports that she does not use drugs.     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   HENT: Negative.    Eyes: Negative.   Respiratory: Negative.    Cardiovascular: Negative.   Gastrointestinal:        Gravid  Endocrine: Negative.   Genitourinary: Negative.    Musculoskeletal: Negative.   Allergic/Immunologic: Negative.   Neurological: Negative.   Hematological: Negative.   Psychiatric/Behavioral: Negative.     History Dilation: Fingertip Effacement (%): 50 Station: -3 Exam by:: Rolly Salter CNM Blood pressure 128/88, pulse (!) 101, temperature 98.4 F (36.9 C), temperature source Oral, resp. rate 16, height _0  (1.6 m), weight 80.3 kg, last menstrual period 01/14/2021, SpO2 100 %. Maternal Exam:  Uterine Assessment: Contraction strength is mild.  Contraction frequency is rare.  Abdomen: Fetal presentation: vertex Introitus: Normal vulva. Normal vagina.  Pelvis: adequate for delivery.   Cervix: Cervix evaluated by digital exam.    FHTs:  145 baseline, moderate variability, accels present, decels absent. Physical Exam Genitourinary:    General: Normal vulva.     Prenatal labs: ABO, Rh: O/Positive/-- (02/07 1108) Antibody: Negative (02/07 1108) Rubella: 2.22 (02/07 1108) RPR: Non Reactive (02/07 1108)  HBsAg: Negative (02/07 1108)  HIV: Non Reactive (02/07 1108)  GBS: Negative/-- (08/01 0929)   Assessment/Plan: IUP [redacted] weeks gestation Bishop score 2 For IOL. Cytotec placed: 25 oral, and 25 mcg vaginally. Reassuring, Cat 1 FHTs Will recheck in 4-6 hours. The process of IOL has been carefully reviewed with the patient and her husband. She likely plans epidural. Anticipate SVD.  Imagene Riches, CNM  10/28/2021 9:22 AM     Joycelyn Schmid Lynett Fish 10/28/2021, 9:00  AM     

## 2021-10-29 LAB — CBC
HCT: 32.4 % — ABNORMAL LOW (ref 36.0–46.0)
Hemoglobin: 11.4 g/dL — ABNORMAL LOW (ref 12.0–15.0)
MCH: 31.1 pg (ref 26.0–34.0)
MCHC: 35.2 g/dL (ref 30.0–36.0)
MCV: 88.5 fL (ref 80.0–100.0)
Platelets: 220 10*3/uL (ref 150–400)
RBC: 3.66 MIL/uL — ABNORMAL LOW (ref 3.87–5.11)
RDW: 13.8 % (ref 11.5–15.5)
WBC: 23.1 10*3/uL — ABNORMAL HIGH (ref 4.0–10.5)
nRBC: 0 % (ref 0.0–0.2)

## 2021-10-29 LAB — RPR: RPR Ser Ql: NONREACTIVE

## 2021-10-29 MED ORDER — VARICELLA VIRUS VACCINE LIVE 1350 PFU/0.5ML IJ SUSR
0.5000 mL | Freq: Once | INTRAMUSCULAR | Status: AC
  Administered 2021-10-30: 0.5 mL via SUBCUTANEOUS
  Filled 2021-10-29: qty 0.5

## 2021-10-29 MED ORDER — LACTATED RINGERS IV BOLUS: 500.0000 mL | Freq: Once | INTRAVENOUS | Status: DC | PRN

## 2021-10-29 NOTE — Progress Notes (Signed)
Post Partum Day 0 Subjective: Debra Hodge is feeling well but tired.   Objective: Blood pressure 117/85, pulse (!) 110, temperature 98.6 F (37 C), temperature source Oral, resp. rate 20, height 5\' 3"  (1.6 m), weight 80.3 kg, last menstrual period 01/14/2021, SpO2 97 %, unknown if currently breastfeeding.  Physical Exam:  General: alert, cooperative, and appears stated age Cardiac: RRR, no murmur, slightly tachycardic Lungs: CTAB Lochia: appropriate Uterine Fundus: firm Laceration: healing well, mild edema DVT Evaluation: No evidence of DVT seen on physical exam.  Recent Labs    10/28/21 0806 10/29/21 0451  HGB 13.3 11.4*  HCT 37.6 32.4*    Assessment/Plan: Plan for discharge tomorrow Lactation assistance as needed Encouraged to increase PO fluids. IV bolus if indicated. CBC in AM   LOS: 1 day   12/29/21, CNM 10/29/2021, 10:34 AM

## 2021-10-29 NOTE — Anesthesia Postprocedure Evaluation (Signed)
Anesthesia Post Note  Patient: Debra Hodge  Procedure(s) Performed: AN AD HOC LABOR EPIDURAL  Patient location during evaluation: Mother Baby Anesthesia Type: Epidural Level of consciousness: awake and alert Pain management: pain level controlled Vital Signs Assessment: post-procedure vital signs reviewed and stable Respiratory status: spontaneous breathing, nonlabored ventilation and respiratory function stable Cardiovascular status: stable Postop Assessment: no headache, no backache and epidural receding Anesthetic complications: no   No notable events documented.   Last Vitals:  Vitals:   10/29/21 0338 10/29/21 0339  BP: 117/85   Pulse: (!) 117 (!) 110  Resp: 20   Temp: 37 C   SpO2: 97%     Last Pain:  Vitals:   10/29/21 0457  TempSrc:   PainSc: 3                  Dametra Whetsel B Alonza Smoker

## 2021-10-29 NOTE — Lactation Note (Signed)
This note was copied from a baby's chart. Lactation Consultation Note  Patient Name: Girl Bryttney Netzer [id number] Date: 10/29/2021 Reason for consult: Initial assessment;Primapara;Term Age:32 hours  Maternal Data This is mom's first baby, SVD. Mom is breastfeeding her baby. Mom with history of depression.  Met with parents today. Mom reports baby has been latching and breastfeeding. Baby with multiple stool and wet diapers.   Has patient been taught Hand Expression?: Yes Does the patient have breastfeeding experience prior to this delivery?: No  Feeding Mother's Current Feeding Choice: Breast Milk  Interventions Interventions: Breast feeding basics reviewed;Hand express;Hand pump;Education Reviewed what to expect when breastfeeding in the first few days.Reviewed cluster feeding.   Discharge Discharge Education: Engorgement and breast care;Warning signs for feeding baby (Mom will bring baby for f/u at [facility] Mom aware now she can call [facility] if she goes home and has any breastfeeding questions and/or would like to been seen outpatient for lactation.) Pump: Personal;Manual  Consult Status Consult Status: Follow-up Date: 10/30/21 Follow-up type: In-patient  Update provided to care nurse.  Jonna Horald Birky 10/29/2021, 5:59 PM

## 2021-10-30 LAB — CBC
HCT: 27.7 % — ABNORMAL LOW (ref 36.0–46.0)
Hemoglobin: 9.4 g/dL — ABNORMAL LOW (ref 12.0–15.0)
MCH: 30.6 pg (ref 26.0–34.0)
MCHC: 33.9 g/dL (ref 30.0–36.0)
MCV: 90.2 fL (ref 80.0–100.0)
Platelets: 219 10*3/uL (ref 150–400)
RBC: 3.07 MIL/uL — ABNORMAL LOW (ref 3.87–5.11)
RDW: 14.3 % (ref 11.5–15.5)
WBC: 15.8 10*3/uL — ABNORMAL HIGH (ref 4.0–10.5)
nRBC: 0 % (ref 0.0–0.2)

## 2021-10-30 MED ORDER — FERROUS SULFATE 325 (65 FE) MG PO TABS: 325.0000 mg | ORAL_TABLET | Freq: Two times a day (BID) | ORAL | 1 refills | Status: AC

## 2021-10-30 NOTE — Lactation Note (Signed)
This note was copied from a baby's chart. Lactation Consultation Note  Patient Name: Debra Hodge [id number] Date: 10/30/2021 Reason for consult: Follow-up assessment;Primapara;Term Age:32 hours  Maternal Data Has patient been taught Hand Expression?: Yes Does the patient have breastfeeding experience prior to this delivery?: No  Feeding Mother's Current Feeding Choice: Breast Milk  LATCH Score Latch: Grasps breast easily, tongue down, lips flanged, rhythmical sucking.  Audible Swallowing: A few with stimulation  Type of Nipple: Everted at rest and after stimulation  Comfort (Breast/Nipple): Filling, red/small blisters or bruises, mild/mod discomfort  Hold (Positioning): No assistance needed to correctly position infant at breast.  LATCH Score: 8   Lactation Tools Discussed/Used    Interventions Interventions: Adjust position;Education  Discharge Discharge Education: Engorgement and breast care Pump: Manual;Personal WIC Program: No  Consult Status Consult Status: Complete Date: 10/30/21 Follow-up type: In-patient    [name] 10/30/2021, 12:05 PM

## 2021-10-30 NOTE — Progress Notes (Signed)
Subjective:    Debra Hodge is a 31 y.o. female being seen today for her obstetrical visit. She is at [redacted]w[redacted]d gestation. Patient reports no bleeding, no contractions, no cramping, and no leaking. Fetal movement: normal.  Menstrual History: OB History     Gravida  1   Para  1   Term  1   Preterm  0   AB  0   Living  1      SAB  0   IAB  0   Ectopic  0   Multiple  0   Live Births  1            Patient's last menstrual period was 01/14/2021 (exact date).    The following portions of the patient's history were reviewed and updated as appropriate: allergies, current medications, past family history, past medical history, past social history, past surgical history, and problem list.  Review of Systems A comprehensive review of systems was negative.   Objective:    BP 120/70   Wt 177 lb (80.3 kg)   LMP 01/14/2021 (Exact Date)   BMI 31.35 kg/m  FHT: 140 BPM  Uterine Size: 37 cm  Presentations: cephalic  Pelvic Exam:              Dilation:        Effacement:              Station:      Consistency:             Position:      Assessment:    Pregnancy 38 and 0/7 weeks   Plan:   Plans for delivery: Vaginal anticipated Counseling:  labor precautions Follow up in 1 Week.

## 2021-10-30 NOTE — Progress Notes (Signed)
Mother discharged. Discharge instructions given. Mother verbalizes understanding. Transported by axillary.  

## 2021-11-10 ENCOUNTER — Ambulatory Visit (INDEPENDENT_AMBULATORY_CARE_PROVIDER_SITE_OTHER): Payer: Medicaid Other | Admitting: Obstetrics

## 2021-11-10 ENCOUNTER — Encounter: Payer: Self-pay | Admitting: Obstetrics

## 2021-11-10 ENCOUNTER — Ambulatory Visit: Payer: Medicaid Other | Admitting: Obstetrics

## 2021-11-10 NOTE — Progress Notes (Signed)
Postpartum Visit  Chief Complaint:  Chief Complaint  Patient presents with   Postpartum Care    History of Present Illness: Patient is a 31 y.o. G1P1001 presents for postpartum visit.  Date of delivery: 10/25/2021 Type of delivery: Vaginal delivery - Vacuum or forceps assisted  no Episiotomy No.  Laceration: yes second degree Pregnancy or labor problems:  no Any problems since the delivery:  no  Newborn Details:  SINGLETON :  1. Baby's name: girl. Birth weight: 8 lbs 10oz Maternal Details:  Breast Feeding:  yes Post partum depression/anxiety noted:  no Edinburgh Post-Partum Depression Score:  not done- phone interview    Past Medical History:  Diagnosis Date   BRCA negative 06/2020   MyRisk neg   Depression    Family history of breast cancer    Family history of uterine cancer    in her mom   Increased risk of breast cancer 06/2020   IBIS=21.1%/riskscore=5.5%    History reviewed. No pertinent surgical history.  Prior to Admission medications   Medication Sig Start Date End Date Taking? Authorizing Provider  ferrous sulfate (FERROUSUL) 325 (65 FE) MG tablet Take 1 tablet (325 mg total) by mouth 2 (two) times daily. 10/30/21  Yes Dominic, Nunzio Cobbs, CNM  Prenatal Vit-Fe Fumarate-FA (MULTIVITAMIN-PRENATAL) 27-0.8 MG TABS tablet Take 1 tablet by mouth daily at 12 noon.   Yes [provider]    Allergies  Allergen Reactions   Shrimp [Shellfish Allergy] Other (See Comments)    Headache and vomiting     Social History   Socioeconomic History   Marital status: Married    Spouse name: Not on file   Number of children: Not on file   Years of education: Not on file   Highest education level: Not on file  Occupational History   Not on file  Tobacco Use   Smoking status: Never   Smokeless tobacco: Never  Vaping Use   Vaping Use: Never used  Substance and Sexual Activity   Alcohol use: Yes    Alcohol/week: 0.0 standard drinks of alcohol    Comment:  occasionally   Drug use: No   Sexual activity: Yes    Birth control/protection: Pill, None  Other Topics Concern   Not on file  Social History Narrative   Not on file   Social Determinants of Health   Financial Resource Strain: Not on file  Food Insecurity: No Food Insecurity (10/28/2021)   Hunger Vital Sign    Worried About Running Out of Food in the Last Year: Never true    Ran Out of Food in the Last Year: Never true  Transportation Needs: No Transportation Needs (10/28/2021)   PRAPARE - Hydrologist (Medical): No    Lack of Transportation (Non-Medical): No  Physical Activity: Not on file  Stress: Not on file  Social Connections: Not on file  Intimate Partner Violence: Not At Risk (10/28/2021)   Humiliation, Afraid, Rape, and Kick questionnaire    Fear of Current or Ex-Partner: No    Emotionally Abused: No    Physically Abused: No    Sexually Abused: No    Family History  Problem Relation Age of Onset   Alcohol abuse Father    Depression Father    Colon cancer Maternal Uncle 68   Breast cancer Paternal Aunt 27   Arthritis Maternal Grandmother    Cancer Maternal Grandmother        lung cancer   Diabetes Maternal Grandmother  Arthritis Maternal Grandfather    Diabetes Maternal Grandfather    Alcohol abuse Paternal Grandmother    Endometrial cancer Mother 66    Review of Systems  Constitutional: Negative.   HENT: Negative.    Eyes: Negative.   Respiratory: Negative.    Cardiovascular: Negative.   Gastrointestinal: Negative.   Genitourinary: Negative.   Musculoskeletal: Negative.   Skin: Negative.   Neurological: Negative.   Endo/Heme/Allergies: Negative.   Psychiatric/Behavioral: Negative.       Physical Exam There were no vitals taken for this visit. This visit is a virtual , phone visit.    Female Chaperone present during breast and/or pelvic exam.  Assessment: 31 y.o. G1P1001 presenting for2 week postpartum visit/ phone  visit. Doing well.  Plan: Problem List Items Addressed This Visit       Other   Postpartum care following vaginal delivery - Primary     1) Contraception Education given regarding options for contraception, including oral contraceptives.  2)  Pap - ASCCP guidelines and rational discussed.  Patient opts for q 3 year screening interval  3) Patient underwent screening for postpartum depression with no concerns noted.  4) Follow up 4 weeks for her PP exam  Imagene Riches, CNM  11/10/2021 1:29 PM   11/10/2021 1:28 PM

## 2021-12-09 ENCOUNTER — Encounter: Payer: Self-pay | Admitting: Obstetrics

## 2021-12-09 ENCOUNTER — Ambulatory Visit (INDEPENDENT_AMBULATORY_CARE_PROVIDER_SITE_OTHER): Payer: Medicaid Other | Admitting: Obstetrics

## 2021-12-09 DIAGNOSIS — Z30011 Encounter for initial prescription of contraceptive pills: Secondary | ICD-10-CM

## 2021-12-09 MED ORDER — NORETHINDRONE 0.35 MG PO TABS: 1.0000 | ORAL_TABLET | Freq: Every day | ORAL | 3 refills | Status: DC

## 2021-12-09 NOTE — Progress Notes (Signed)
Postpartum Visit  Chief Complaint: No chief complaint on file.   History of Present Illness: Patient is a 31 y.o. G1P1001 presents for postpartum visit.She had a vaginal birth, and reports she is doing well. No physical complaints, she desires the minipill for [facility]. She continues to breastfeed, and this is going well. Named her daughter Burton Apley.  Date of delivery: 10/28/2021 Type of delivery: Vaginal delivery - Vacuum or forceps assisted  no Episiotomy No.  Laceration: yes; 2nd degree with repoair  Pregnancy or labor problems:  no Any problems since the delivery:  no  Newborn Details:  SINGLETON :  1. Baby's name: Naida Sleight. Birth weight: 18 gmw Maternal Details:  Breast Feeding:  yes Post partum depression/anxiety noted:  no Edinburgh Post-Partum Depression Score:  2  Date of last PAP: 07/02/2020  normal   Past Medical History:  Diagnosis Date   BRCA negative 06/2020   MyRisk neg   Depression    Family history of breast cancer    Family history of uterine cancer    in her mom   Increased risk of breast cancer 06/2020   IBIS=21.1%/riskscore=5.5%    History reviewed. No pertinent surgical history.  Prior to Admission medications   Medication Sig Start Date End Date Taking? Authorizing Provider  ferrous sulfate (FERROUSUL) 325 (65 FE) MG tablet Take 1 tablet (325 mg total) by mouth 2 (two) times daily. 10/30/21  Yes Dominic, Nunzio Cobbs, CNM  Prenatal Vit-Fe Fumarate-FA (MULTIVITAMIN-PRENATAL) 27-0.8 MG TABS tablet Take 1 tablet by mouth daily at 12 noon.   Yes [provider]    Allergies  Allergen Reactions   Shrimp [Shellfish Allergy] Other (See Comments)    Headache and vomiting     Social History   Socioeconomic History   Marital status: Married    Spouse name: Not on file   Number of children: Not on file   Years of education: Not on file   Highest education level: Not on file  Occupational History   Not on file  Tobacco Use   Smoking status: Never    Smokeless tobacco: Never  Vaping Use   Vaping Use: Never used  Substance and Sexual Activity   Alcohol use: Yes    Alcohol/week: 0.0 standard drinks of alcohol    Comment: occasionally   Drug use: No   Sexual activity: Yes    Birth control/protection: Pill, None  Other Topics Concern   Not on file  Social History Narrative   Not on file   Social Determinants of Health   Financial Resource Strain: Not on file  Food Insecurity: No Food Insecurity (10/28/2021)   Hunger Vital Sign    Worried About Running Out of Food in the Last Year: Never true    Ran Out of Food in the Last Year: Never true  Transportation Needs: No Transportation Needs (10/28/2021)   PRAPARE - Hydrologist (Medical): No    Lack of Transportation (Non-Medical): No  Physical Activity: Not on file  Stress: Not on file  Social Connections: Not on file  Intimate Partner Violence: Not At Risk (10/28/2021)   Humiliation, Afraid, Rape, and Kick questionnaire    Fear of Current or Ex-Partner: No    Emotionally Abused: No    Physically Abused: No    Sexually Abused: No    Family History  Problem Relation Age of Onset   Alcohol abuse Father    Depression Father    Colon cancer Maternal Uncle 23  Breast cancer Paternal Aunt 100   Arthritis Maternal Grandmother    Cancer Maternal Grandmother        lung cancer   Diabetes Maternal Grandmother    Arthritis Maternal Grandfather    Diabetes Maternal Grandfather    Alcohol abuse Paternal Grandmother    Endometrial cancer Mother 56    Review of Systems  Constitutional: Negative.   HENT: Negative.    Eyes: Negative.   Respiratory: Negative.    Cardiovascular: Negative.   Skin: Negative.   Neurological: Negative.   Psychiatric/Behavioral: Negative.    All other systems reviewed and are negative.    Physical Exam BP 114/74   Pulse 93   Ht 5' 3" (1.6 m)   Wt 72.6 kg   Breastfeeding Yes   BMI 28.34 kg/m   Physical  Exam Constitutional:      Appearance: Normal appearance.  Genitourinary:     Vulva and rectum normal.     Genitourinary Comments: Perineum well healed without tenderness. Uterus is involuted, slightly retroverted. No pelvic masses or pain noted with exam.  Cardiovascular:     Rate and Rhythm: Normal rate and regular rhythm.     Pulses: Normal pulses.     Heart sounds: Normal heart sounds.  Pulmonary:     Effort: Pulmonary effort is normal.     Breath sounds: Normal breath sounds.  Abdominal:     Palpations: Abdomen is soft.  Musculoskeletal:        General: Normal range of motion.     Cervical back: Normal range of motion and neck supple.  Neurological:     Mental Status: She is alert.  Skin:    General: Skin is warm and dry.      Female Chaperone present during breast and/or pelvic exam.  Assessment: 31 y.o. G1P1001 presenting for 6 week postpartum visit Breastfeeding going well  Will RX Micronor  Plan: Problem List Items Addressed This Visit   None    1) Contraception Education given regarding options for contraception, including oral contraceptives.  2)  Pap - ASCCP guidelines and rational discussed.  Patient opts for q 3 years screening interval  3) Patient underwent screening for postpartum depression with no concerns noted.  4) Follow up 1 year for routine annual exam I have sent in a prescription for [place], CNM  12/09/2021 10:04 AM   12/09/2021 10:04 AM

## 2022-05-11 ENCOUNTER — Encounter: Payer: Self-pay | Admitting: Obstetrics

## 2022-05-13 NOTE — Telephone Encounter (Signed)
I called and offered the patient same day appointment for today with [facility]. The patient stated she wasn't available. I then asked if the patient was available to see a nurse for a visit to check for poss uti for Friday, 3/22 at 8:15 am. The patient refused to scheduled, she requested to see a provider only. The next opening is on Friday, 4/12 with Dr. Hulan Fray. The patient is confirmed that day for scheduling.

## 2022-06-04 ENCOUNTER — Encounter: Payer: Self-pay | Admitting: Obstetrics & Gynecology

## 2022-06-04 ENCOUNTER — Ambulatory Visit (INDEPENDENT_AMBULATORY_CARE_PROVIDER_SITE_OTHER): Payer: Medicaid Other | Admitting: Obstetrics & Gynecology

## 2022-06-04 ENCOUNTER — Other Ambulatory Visit (HOSPITAL_COMMUNITY)
Admission: RE | Admit: 2022-06-04 | Discharge: 2022-06-04 | Disposition: A | Discharge status: Home / Self Care | Payer: Medicaid Other | Source: Ambulatory Visit | Attending: Obstetrics & Gynecology | Admitting: Obstetrics & Gynecology

## 2022-06-04 VITALS — BP 115/81 | HR 87 | Ht 63.0 in | Wt 146.0 lb

## 2022-06-04 DIAGNOSIS — N898 Other specified noninflammatory disorders of vagina: Secondary | ICD-10-CM | POA: Diagnosis present

## 2022-06-04 DIAGNOSIS — N3941 Urge incontinence: Secondary | ICD-10-CM

## 2022-06-04 LAB — POCT URINALYSIS DIPSTICK
Bilirubin, UA: NEGATIVE
Blood, UA: NEGATIVE
Glucose, UA: NEGATIVE
Ketones, UA: NEGATIVE
Leukocytes, UA: NEGATIVE
Nitrite, UA: NEGATIVE
Protein, UA: NEGATIVE
Spec Grav, UA: 1.01 (ref 1.010–1.025)
Urobilinogen, UA: 1 E.U./dL
pH, UA: 6.5 (ref 5.0–8.0)

## 2022-06-04 NOTE — Progress Notes (Signed)
   Established Patient Office Visit  Subjective   Patient ID: Debra Hodge, female    DOB: 29-Oct-1990  Age: 32 y.o. MRN: [id number]  Chief Complaint  Patient presents with   Bladder Prolapse    Urine leaking      HPI   Debra Hodge (71 month old daughter) is here with a several month h/o noticing an odor in her panties when she changes them along with maybe a few drops of urine. She feels like she may lose a few drops of urine when she passes gas. She denies urinary loss with sneezing, coughing, lifting heavy objects. She had only 1 occasion a month ago when she lost enough urine to need to change her panties.  Her baby was delivered vaginally and has been doing Kegel exercises. She has not had sex since delivery (relationship issue). She is breastfeeding. Objective:     BP 115/81   Pulse 87   Ht 5\' 3"  (1.6 m)   Wt 146 lb (66.2 kg)   BMI 25.86 kg/m    Physical Exam   Results for orders placed or performed in visit on 06/04/22  POCT urinalysis dipstick  Result Value Ref Range   Color, UA     Clarity, UA     Glucose, UA Negative Negative   Bilirubin, UA Negative    Ketones, UA Negative    Spec Grav, UA 1.010 1.010 - 1.025   Blood, UA Negative    pH, UA 6.5 5.0 - 8.0   Protein, UA Negative Negative   Urobilinogen, UA 1.0 0.2 or 1.0 E.U./dL   Nitrite, UA Negative    Leukocytes, UA Negative Negative   Appearance     Odor        Well nourished, well hydrated White female, no apparent distress She is ambulating and conversing normally. EG- vulva normal, doesn't even look like she had a baby I used a [employer] (and even this was a tight fit). Cervix appears normal. There is no prolapse at all. There was no urine loss with Valsalva.  Assessment & Plan:  Vaginal odor- Aptima sent Rec continue Kegel's Reassurance given  Problem List Items Addressed This Visit   None Visit Diagnoses     Urge incontinence of urine    -  Primary   Relevant Orders    POCT urinalysis dipstick (Completed)       No follow-ups on file.    Allie Bossier, MD

## 2022-06-08 LAB — CERVICOVAGINAL ANCILLARY ONLY
Bacterial Vaginitis (gardnerella): NEGATIVE
Candida Glabrata: NEGATIVE
Candida Vaginitis: NEGATIVE
Comment: NEGATIVE
Comment: NEGATIVE
Comment: NEGATIVE
Comment: NEGATIVE
Trichomonas: NEGATIVE

## 2022-07-04 IMAGING — US US OB < 14 WEEKS - US OB TV
1 series · 14 of 28 positions shown · non-contrast
Comparison: None.

CLINICAL DATA: Initial evaluation for early pregnancy, dating.

EXAM:
OBSTETRIC <14 WK US AND TRANSVAGINAL OB US
TECHNIQUE: Both transabdominal and transvaginal ultrasound examinations were
performed for complete evaluation of the gestation as well as the
maternal uterus, adnexal regions, and pelvic cul-de-sac.
Transvaginal technique was performed to assess early pregnancy.

[Series 1: us ob < 14 weeks - us ob tv · 0.20mm/px · 14 of 54 slices shown]
[im 2/54]
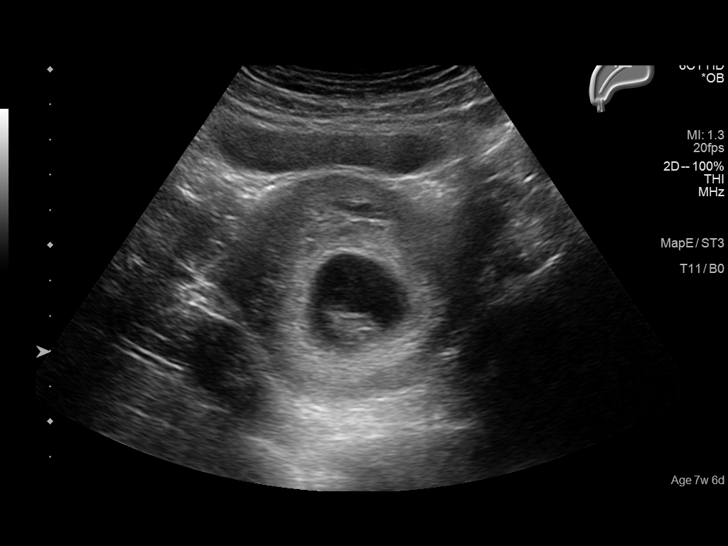
[im 6/54]
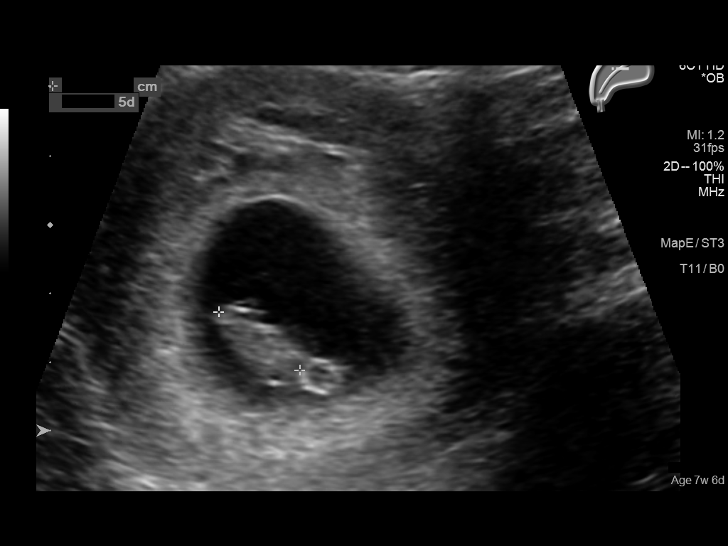
[im 10/54]
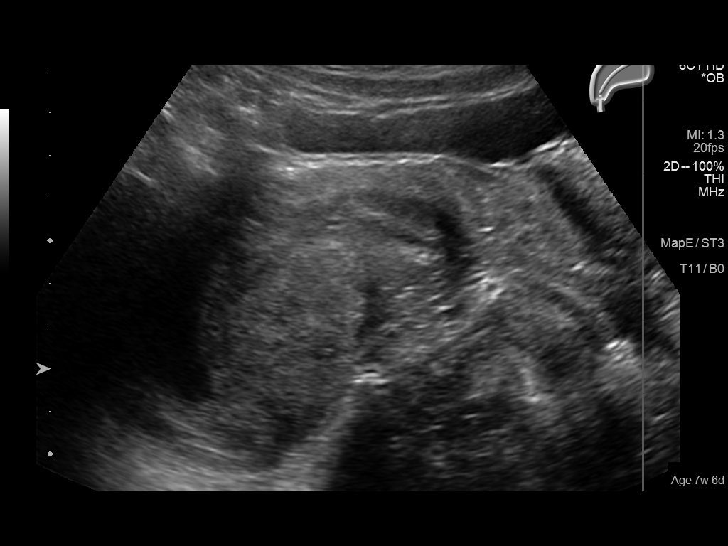
[im 14/54]
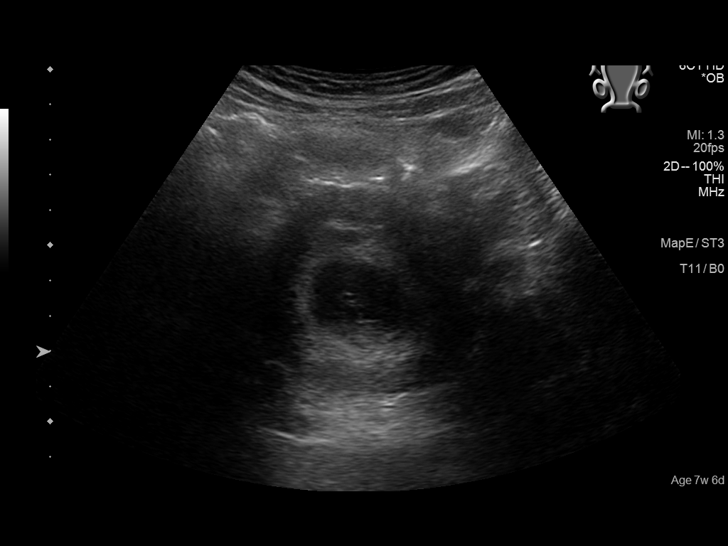
[im 18/54]
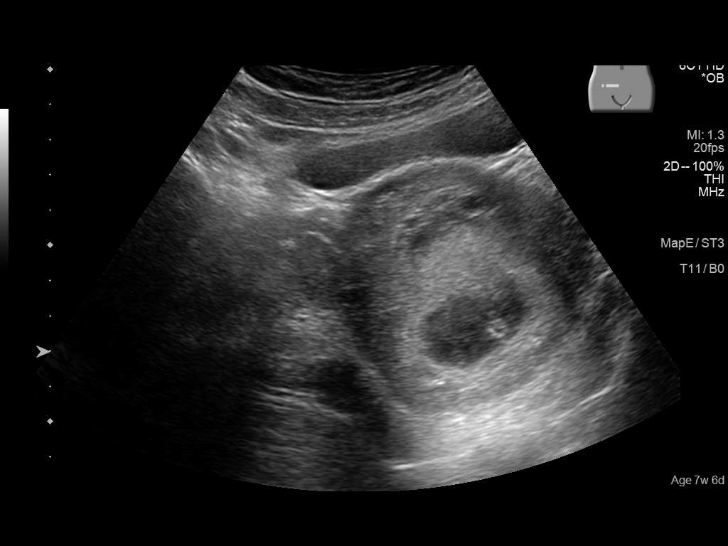
[im 22/54]
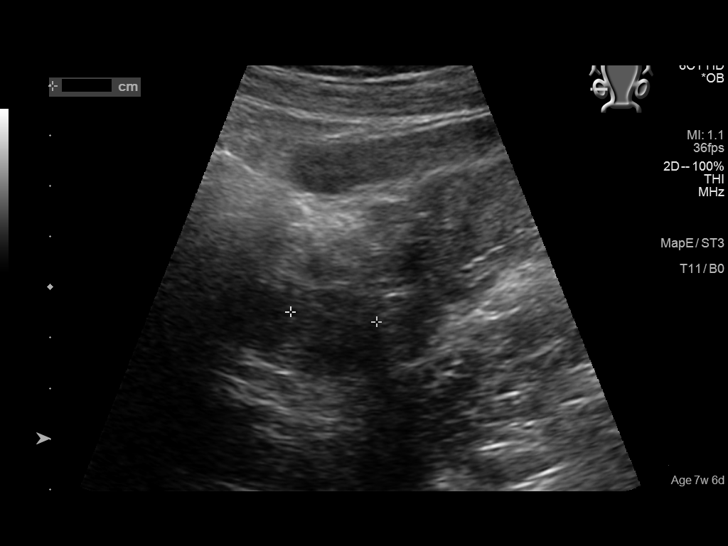
[im 26/54]
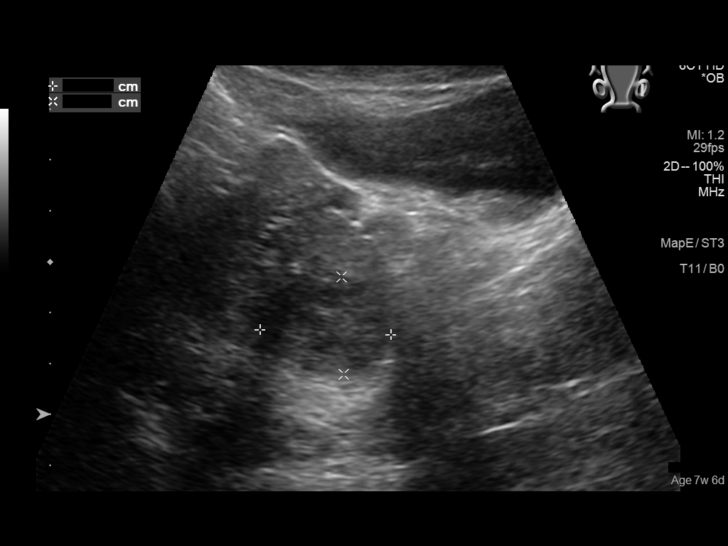
[im 30/54]
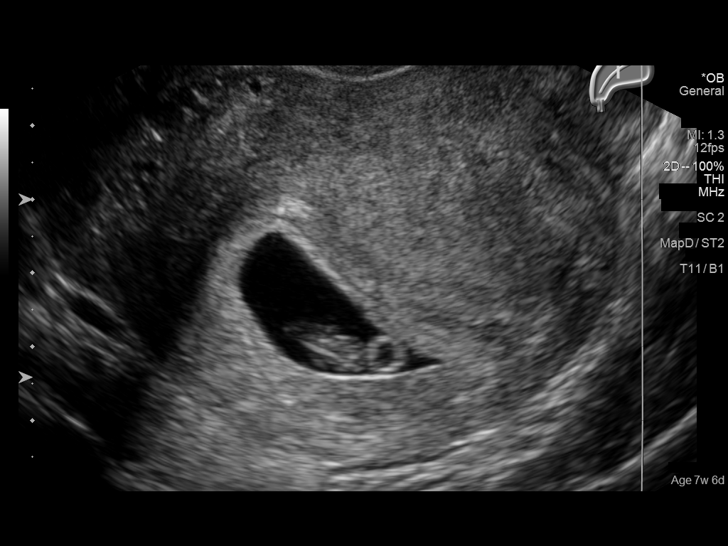
[im 34/54]
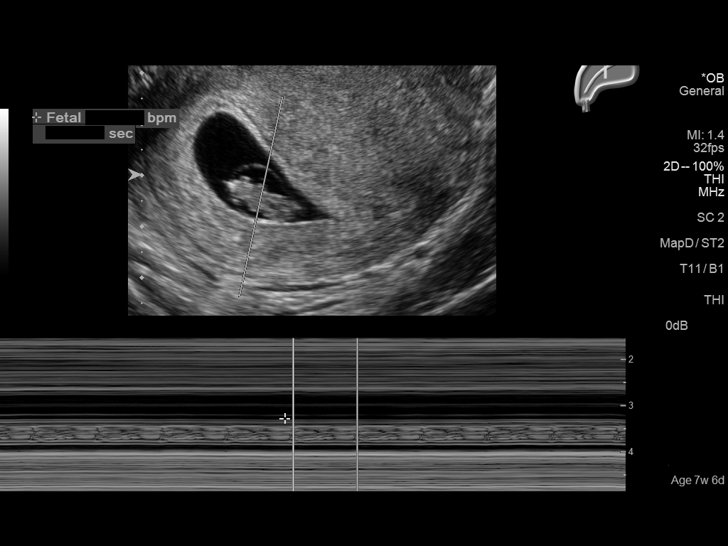
[im 38/54]
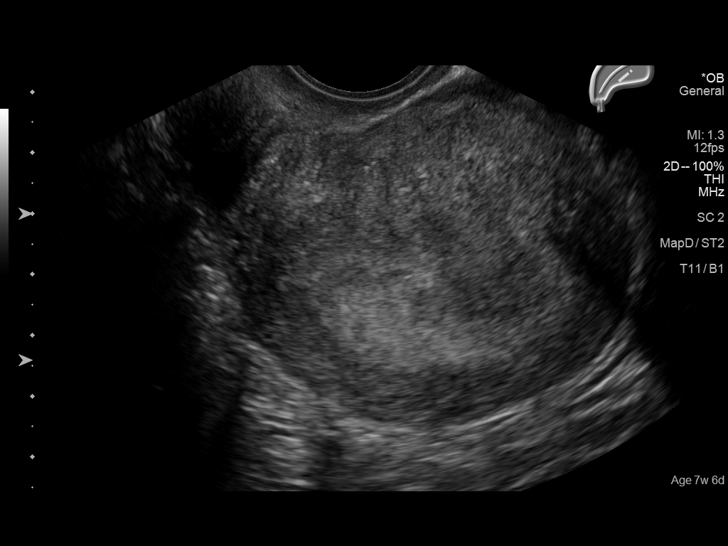
[im 42/54]
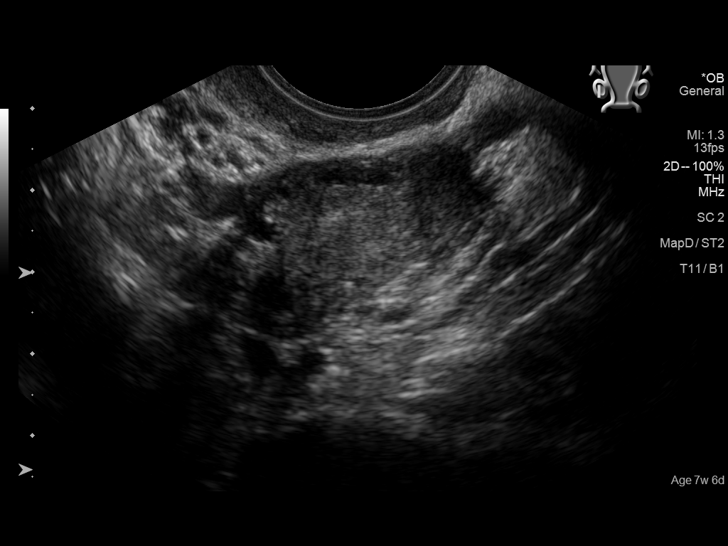
[im 46/54]
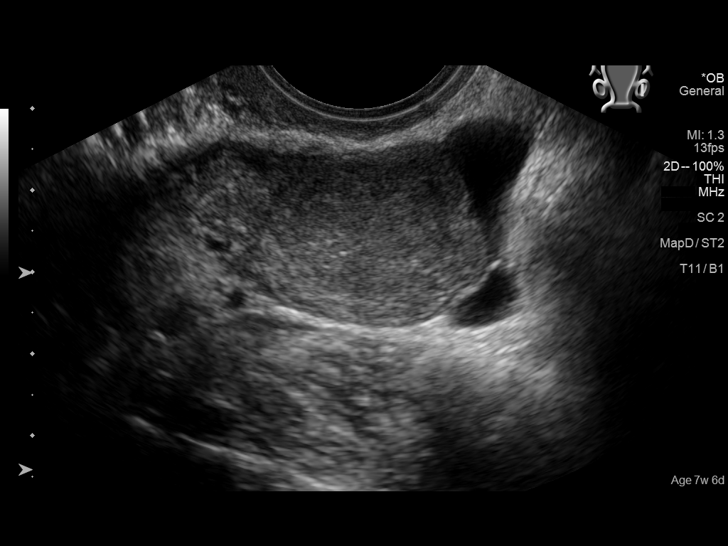
[im 50/54]
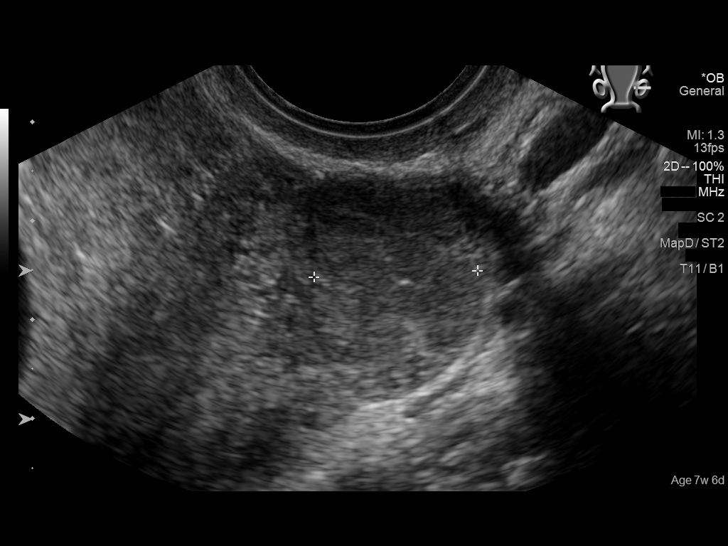
[im 54/54]
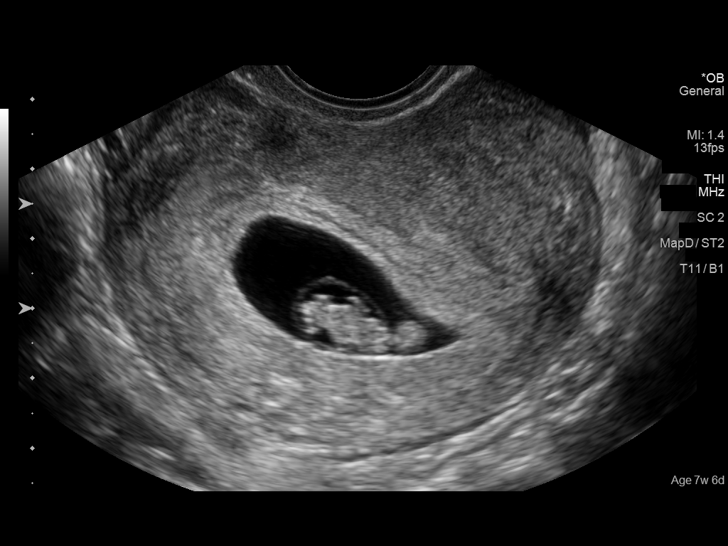

[14 of 28 positions shown; findings below may reference images not displayed]

FINDINGS: Intrauterine gestational sac: Single

Yolk sac:  Present

Embryo:  Present

Cardiac Activity: Present

Heart Rate: 160 bpm

CRL:  14.4 mm   7 w   5 d                  US EDC: 10/22/2021

Subchorionic hemorrhage:  None visualized.

Maternal uterus/adnexae: Ovaries within normal limits. Small corpus
luteal cyst noted on the left. No adnexal mass. Trace free fluid
seen within the pelvis.
IMPRESSION: Single viable intrauterine pregnancy, estimated gestational age 7
weeks and 5 days by crown-rump length, with ultrasound EDC of
10/22/2021. No complication.

## 2022-09-07 IMAGING — US US OB COMP +14 WK
1 of 2 series · 13 of 28 positions shown · non-contrast
Comparison: none

CLINICAL DATA: Fetal anatomic survey

EXAM:
OBSTETRICAL ULTRASOUND >14 WKS

[Series 1: us ob comp +14 wk · 0.22mm/px · 13 of 79 slices shown]
[im 4/79]
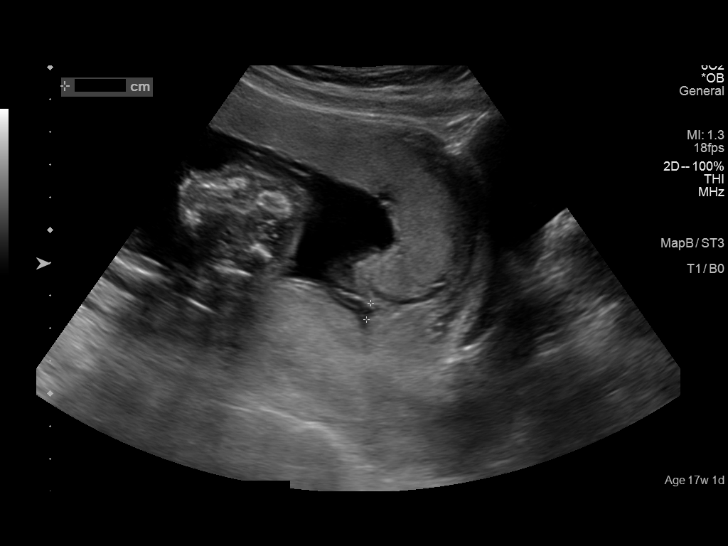
[im 10/79]
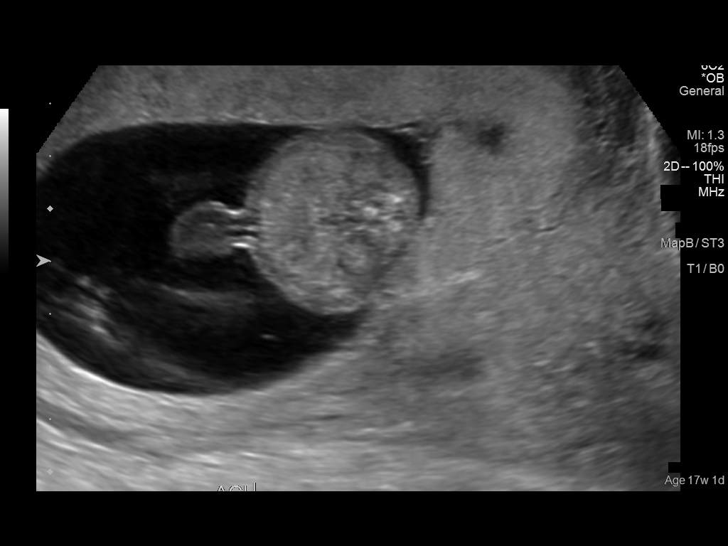
[im 16/79]
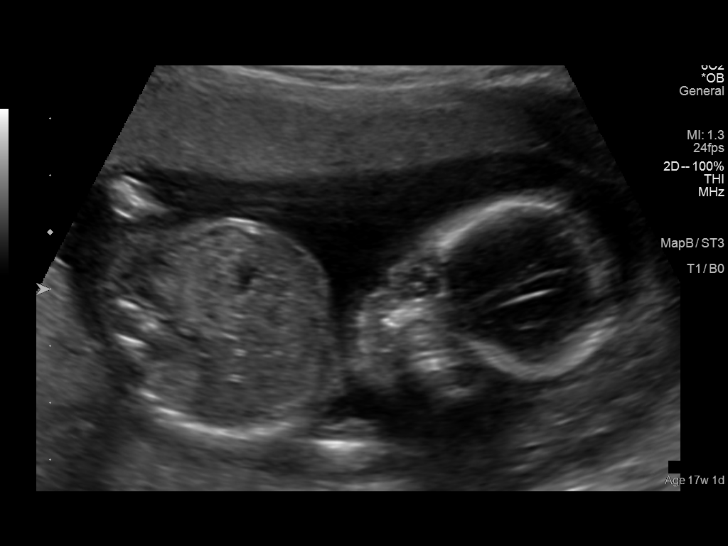
[im 22/79]
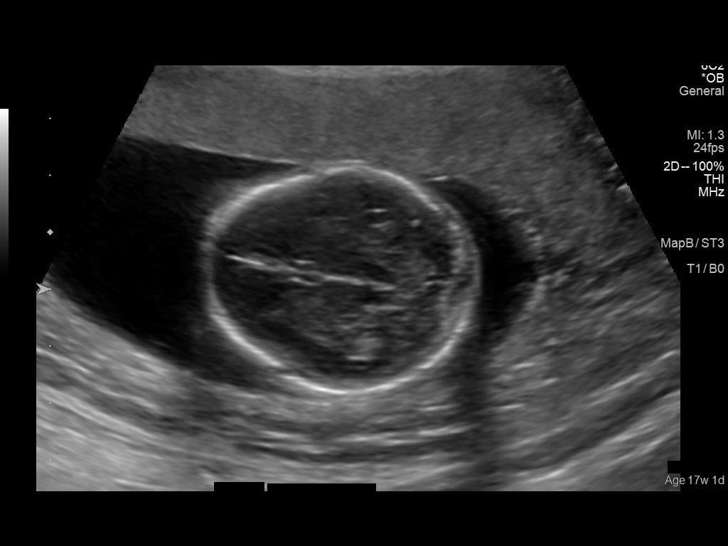
[im 28/79]
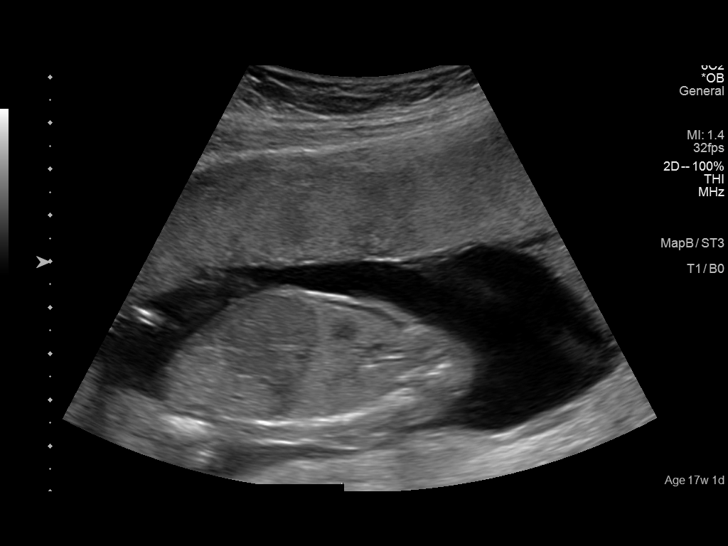
[im 34/79]
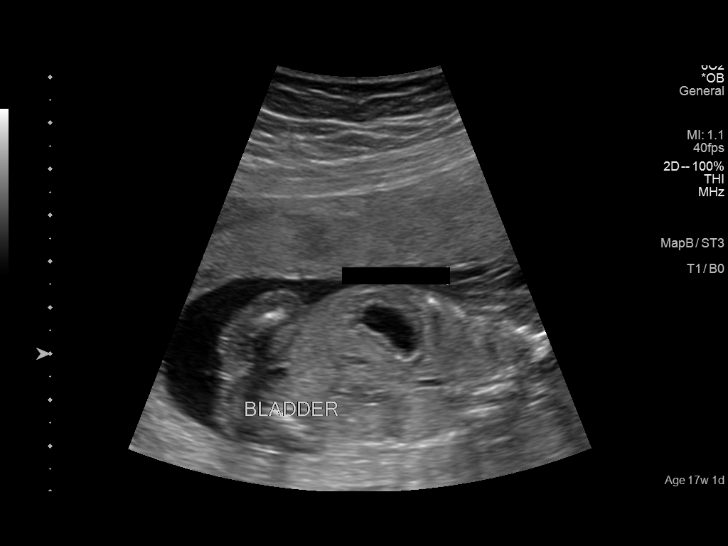
[im 43/79]
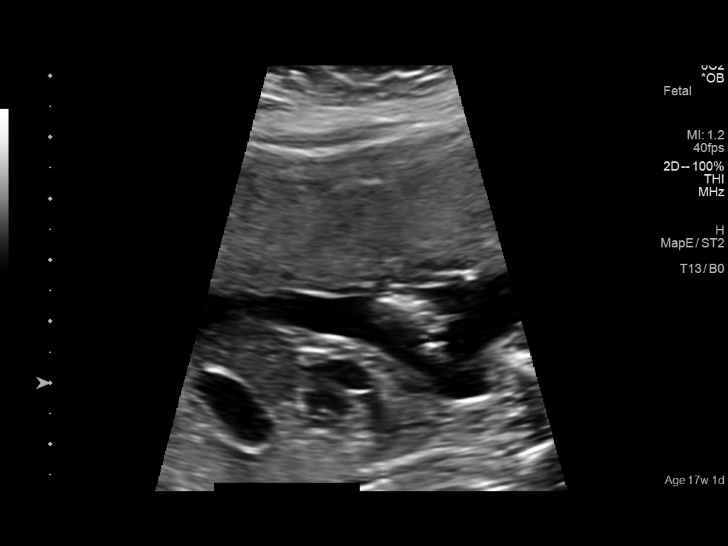
[im 49/79]
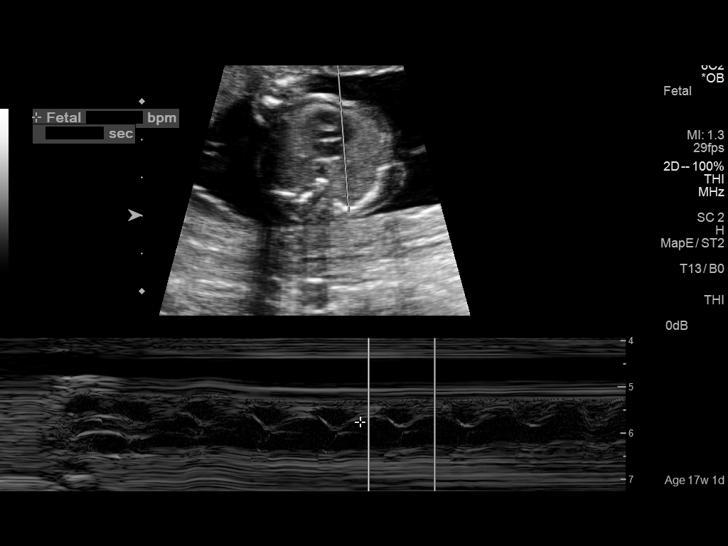
[im 55/79]
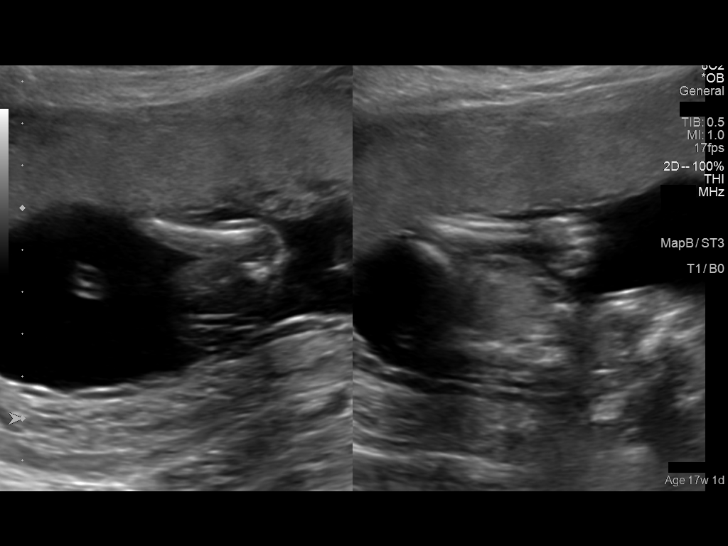
[im 61/79]
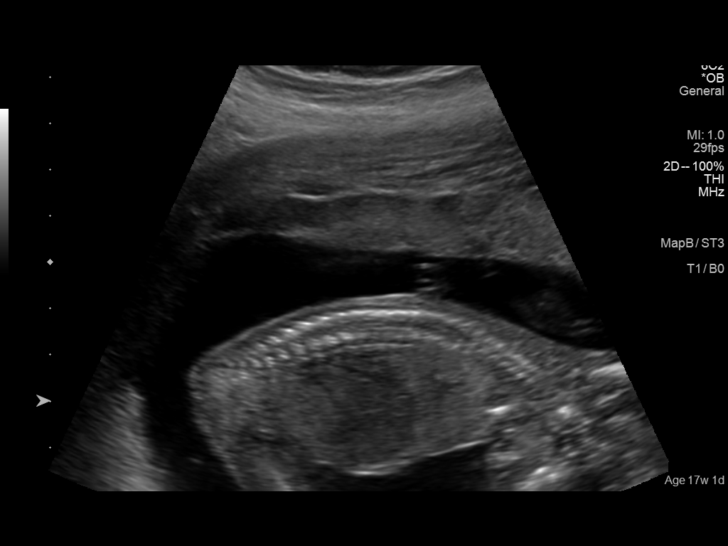
[im 67/79]
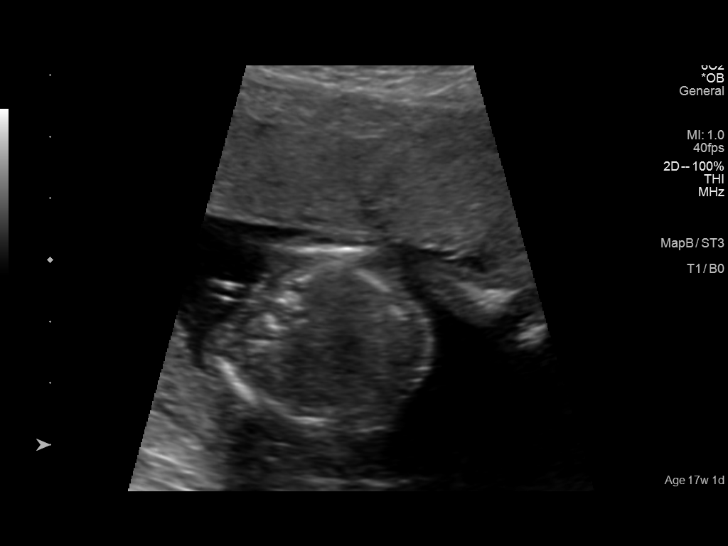
[im 73/79]
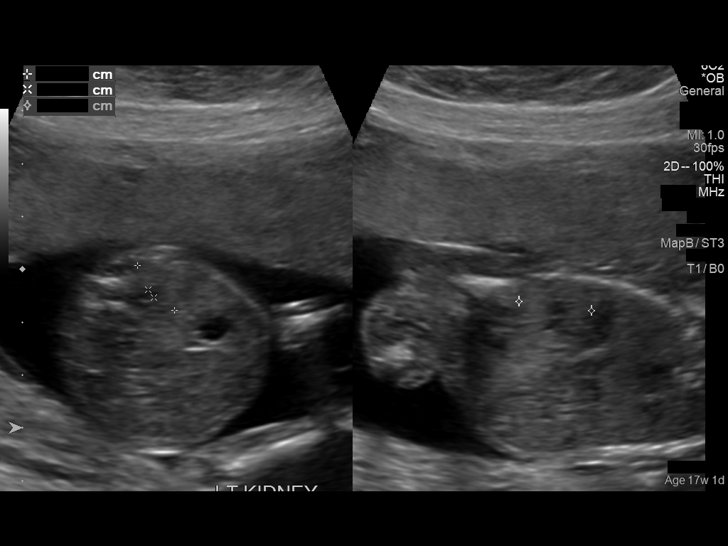
[im 79/79]
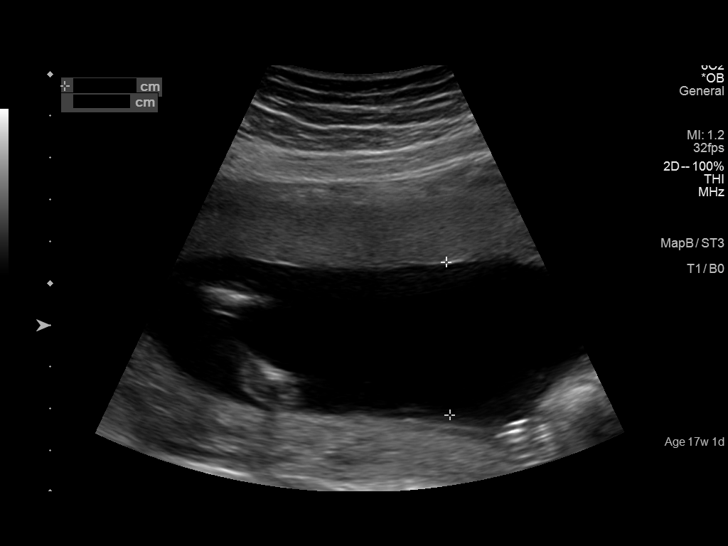

[13 of 28 positions shown; findings below may reference images not displayed]

FINDINGS: Number of Fetuses: 1

Heart Rate:  155 bpm

Movement: Yes

Presentation: Variable

Previa: No

Placental Location: Anterior

Amniotic Fluid (Subjective): Normal

Amniotic Fluid (Objective):

Vertical pocket = 3.7cm

FETAL BIOMETRY

BPD: 4.0cm 18w 1d

HC:   14.2cm 17w 3d

AC:   12.1cm 17w 6d

FL:   2.4cm 17w 2d

Current Mean GA: 17w 5d US EDC: 10/17/2021

Assigned GA:  17w 1d Assigned EDC: 10/21/2021

FETAL ANATOMY

Lateral Ventricles: Appears normal

Thalami/CSP: Appears normal

Posterior Fossa:  Appears normal

Nuchal Region: Appears normal   NFT= 3.1 mm

Upper Lip: Appears normal

Spine: Appears normal

4 Chamber Heart on Left: Appears normal

LVOT: Appears normal

RVOT: Appears normal

Stomach on Left: Appears normal

3 Vessel Cord: Appears normal

Cord Insertion site: Appears normal

Kidneys: Appears normal

Bladder: Appears normal

Extremities: Appears normal

Technically difficult due to: Early gestational age

Maternal Findings:

Cervix:  4.5 cm
IMPRESSION: 1. Single live intrauterine pregnancy as above, estimated age 17
weeks and 5 days.
2. Normal fetal anatomic survey.  No fetal anomalies identified.

## 2023-02-24 ENCOUNTER — Other Ambulatory Visit: Payer: Self-pay | Admitting: Obstetrics

## 2023-02-24 DIAGNOSIS — Z30011 Encounter for initial prescription of contraceptive pills: Secondary | ICD-10-CM

## 2023-02-25 ENCOUNTER — Telehealth: Payer: Self-pay

## 2023-02-25 ENCOUNTER — Other Ambulatory Visit: Payer: Self-pay

## 2023-02-25 ENCOUNTER — Other Ambulatory Visit: Payer: Self-pay | Admitting: Obstetrics

## 2023-02-25 DIAGNOSIS — Z30011 Encounter for initial prescription of contraceptive pills: Secondary | ICD-10-CM

## 2023-02-25 MED ORDER — NORETHINDRONE 0.35 MG PO TABS: 1.0000 | ORAL_TABLET | Freq: Every day | ORAL | 0 refills | Status: AC

## 2023-02-25 NOTE — Telephone Encounter (Signed)
 Pt called for [facility] refill, she states she has moved 4 hours away and has not found a drs office where she lives. Pt aware the best I could do was send in 1 month refill but she would need to find a dr to refill. Pt aware. Amisha Pospisil cma

## 2023-02-25 NOTE — Telephone Encounter (Signed)
 1 month bc refill sent in
